# Patient Record
Sex: Female | Born: 1955 | Race: White | Hispanic: No | Marital: Married | Smoking: Never smoker
Health system: Southern US, Community
[De-identification: ages and names within clinical notes are randomized; demographics above are authoritative.]

## PROBLEM LIST (undated history)

## (undated) DIAGNOSIS — E119 Type 2 diabetes mellitus without complications: Secondary | ICD-10-CM

## (undated) DIAGNOSIS — M1990 Unspecified osteoarthritis, unspecified site: Secondary | ICD-10-CM

## (undated) DIAGNOSIS — I10 Essential (primary) hypertension: Secondary | ICD-10-CM

## (undated) DIAGNOSIS — H409 Unspecified glaucoma: Secondary | ICD-10-CM

## (undated) DIAGNOSIS — E785 Hyperlipidemia, unspecified: Secondary | ICD-10-CM

## (undated) DIAGNOSIS — E669 Obesity, unspecified: Secondary | ICD-10-CM

## (undated) HISTORY — DX: Unspecified osteoarthritis, unspecified site: M19.90

## (undated) HISTORY — PX: KNEE SURGERY: SHX244

## (undated) HISTORY — PX: TUBAL LIGATION: SHX77

## (undated) HISTORY — PX: WRIST SURGERY: SHX841

## (undated) HISTORY — PX: WISDOM TOOTH EXTRACTION: SHX21

## (undated) HISTORY — DX: Unspecified glaucoma: H40.9

## (undated) HISTORY — DX: Type 2 diabetes mellitus without complications: E11.9

## (undated) HISTORY — DX: Hyperlipidemia, unspecified: E78.5

## (undated) HISTORY — DX: Obesity, unspecified: E66.9

## (undated) HISTORY — DX: Essential (primary) hypertension: I10

---

## 1998-08-05 ENCOUNTER — Ambulatory Visit (HOSPITAL_COMMUNITY): Admission: RE | Admit: 1998-08-05 | Discharge: 1998-08-05 | Payer: Self-pay | Admitting: Internal Medicine

## 1999-08-07 ENCOUNTER — Ambulatory Visit (HOSPITAL_COMMUNITY): Admission: RE | Admit: 1999-08-07 | Discharge: 1999-08-07 | Payer: Self-pay | Admitting: Internal Medicine

## 1999-08-07 ENCOUNTER — Encounter: Payer: Self-pay | Admitting: Internal Medicine

## 2001-02-10 ENCOUNTER — Encounter: Payer: Self-pay | Admitting: Internal Medicine

## 2001-02-10 ENCOUNTER — Ambulatory Visit (HOSPITAL_COMMUNITY): Admission: RE | Admit: 2001-02-10 | Discharge: 2001-02-10 | Payer: Self-pay | Admitting: Internal Medicine

## 2001-04-21 ENCOUNTER — Other Ambulatory Visit: Admission: RE | Admit: 2001-04-21 | Discharge: 2001-04-21 | Payer: Self-pay | Admitting: Obstetrics and Gynecology

## 2003-11-27 ENCOUNTER — Other Ambulatory Visit: Admission: RE | Admit: 2003-11-27 | Discharge: 2003-11-27 | Payer: Self-pay | Admitting: Obstetrics and Gynecology

## 2007-06-30 ENCOUNTER — Ambulatory Visit (HOSPITAL_COMMUNITY): Admission: RE | Admit: 2007-06-30 | Discharge: 2007-06-30 | Payer: Self-pay | Admitting: Internal Medicine

## 2010-10-09 ENCOUNTER — Other Ambulatory Visit: Admission: RE | Admit: 2010-10-09 | Discharge: 2010-10-09 | Payer: Self-pay | Admitting: Internal Medicine

## 2012-02-10 ENCOUNTER — Other Ambulatory Visit (HOSPITAL_COMMUNITY): Payer: Self-pay | Admitting: Internal Medicine

## 2012-02-10 DIAGNOSIS — Z1231 Encounter for screening mammogram for malignant neoplasm of breast: Secondary | ICD-10-CM

## 2012-03-01 ENCOUNTER — Ambulatory Visit: Payer: BC Managed Care – PPO

## 2012-03-01 ENCOUNTER — Ambulatory Visit (INDEPENDENT_AMBULATORY_CARE_PROVIDER_SITE_OTHER): Payer: BC Managed Care – PPO | Admitting: Internal Medicine

## 2012-03-01 VITALS — BP 123/77 | HR 72 | Temp 98.5°F | Resp 18 | Ht 62.0 in | Wt 237.0 lb

## 2012-03-01 DIAGNOSIS — M25569 Pain in unspecified knee: Secondary | ICD-10-CM

## 2012-03-01 NOTE — Progress Notes (Signed)
  Subjective:    Patient ID: Jocelyn Meyer, female    DOB: 1956-11-06, 56 y.o.   MRN: [id number]  HPI No injury. Has pain every time she gets up to walk, has no pain now and can walk without pain now. Pain is in crease of knee posterior. No locking ,popping, giving way. Pain is unpredictable at times   Review of Systems     Objective:   Physical Exam R knee has full ROM Mcmurrys, patella stretch,, drawer all negative. No effusion or swelling and no palpable tenderness  [facility] reading (PRIMARY) by  Dr.Jinelle Butchko Normal knee        Assessment & Plan:  Probable mild meniscus injury Knee exercises and stretches Tylenol Consider orhto consult

## 2012-03-04 ENCOUNTER — Ambulatory Visit (HOSPITAL_COMMUNITY)
Admission: RE | Admit: 2012-03-04 | Discharge: 2012-03-04 | Disposition: A | Discharge status: Home / Self Care | Payer: BC Managed Care – PPO | Source: Ambulatory Visit | Attending: Internal Medicine | Admitting: Internal Medicine

## 2012-03-04 DIAGNOSIS — Z1231 Encounter for screening mammogram for malignant neoplasm of breast: Secondary | ICD-10-CM | POA: Insufficient documentation

## 2012-03-10 ENCOUNTER — Ambulatory Visit (HOSPITAL_COMMUNITY)
Admission: RE | Admit: 2012-03-10 | Discharge: 2012-03-10 | Disposition: A | Discharge status: Home / Self Care | Payer: BC Managed Care – PPO | Source: Ambulatory Visit | Attending: Orthopedic Surgery | Admitting: Orthopedic Surgery

## 2012-03-10 DIAGNOSIS — M7989 Other specified soft tissue disorders: Secondary | ICD-10-CM | POA: Insufficient documentation

## 2012-03-10 DIAGNOSIS — M79609 Pain in unspecified limb: Secondary | ICD-10-CM

## 2012-03-10 NOTE — Progress Notes (Signed)
VASCULAR LAB PRELIMINARY  PRELIMINARY  PRELIMINARY  PRELIMINARY  Left lower extremity venous duplex has been completed.    Preliminary report: Left leg is negative for deep and superficial vein thrombosis.  Very little fluid was noted in the popliteal fossa or in the proximal calf region.  Vanna Scotland,  RVT 03/10/2012, 6:18 PM

## 2013-09-14 ENCOUNTER — Ambulatory Visit: Payer: BC Managed Care – PPO

## 2013-09-19 ENCOUNTER — Ambulatory Visit: Payer: BC Managed Care – PPO

## 2013-09-21 ENCOUNTER — Ambulatory Visit: Payer: BC Managed Care – PPO

## 2013-09-27 ENCOUNTER — Encounter: Payer: BC Managed Care – PPO | Attending: Nurse Practitioner | Admitting: Dietician

## 2013-09-27 ENCOUNTER — Encounter: Payer: Self-pay | Admitting: Dietician

## 2013-09-27 VITALS — Ht 64.0 in | Wt 215.8 lb

## 2013-09-27 DIAGNOSIS — E119 Type 2 diabetes mellitus without complications: Secondary | ICD-10-CM

## 2013-09-27 DIAGNOSIS — Z713 Dietary counseling and surveillance: Secondary | ICD-10-CM | POA: Insufficient documentation

## 2013-09-27 NOTE — Patient Instructions (Signed)
Goals:  Monitor glucose levels as instructed by your doctor  Bring food record and glucose log to your next nutrition visit 

## 2013-09-27 NOTE — Progress Notes (Signed)
Patient was seen on 09/27/13 for the first of a series of three diabetes self-management courses at the [facility].  Current HbA1c: 6.6 on 10/1  The following learning objectives were met by the patient during this class:  Describe diabetes  State some common risk factors for diabetes  Defines the role of glucose and insulin  Identifies type of diabetes and pathophysiology  Describe the relationship between diabetes and cardiovascular risk  State the members of the Healthcare Team  States the rationale for glucose monitoring  State when to test glucose  State their individual Target Range  State the importance of logging glucose readings  Describe how to interpret glucose readings  Identifies A1C target  Explain the correlation between A1c and eAG values  State symptoms and treatment of high blood glucose  State symptoms and treatment of low blood glucose  Explain proper technique for glucose testing  Identifies proper sharps disposal  Handouts given during class include:  Living Well with Diabetes book  Carb Counting and Meal Planning book  Meal Plan Card  Carbohydrate guide  Meal planning worksheet  Low Sodium Flavoring Tips  The diabetes portion plate  Low Carbohydrate Snack Suggestions  A1c to eAG Conversion Chart  Diabetes Medications  Stress Management  Diabetes Recommended Care Schedule  Diabetes Success Plan  Core Class Satisfaction Survey  Your patient has identified their diabetes care support plan as:  [facility]  Staff   Follow-Up Plan:  Attend core 2

## 2013-10-03 ENCOUNTER — Ambulatory Visit: Payer: BC Managed Care – PPO

## 2013-10-04 ENCOUNTER — Ambulatory Visit: Payer: BC Managed Care – PPO

## 2013-10-11 ENCOUNTER — Ambulatory Visit: Payer: BC Managed Care – PPO

## 2013-10-12 DIAGNOSIS — E119 Type 2 diabetes mellitus without complications: Secondary | ICD-10-CM

## 2013-10-12 NOTE — Progress Notes (Signed)
Patient was seen on 10/12/13 for the second of a series of three diabetes self-management courses at the [facility]. The following learning objectives were met by the patient during this class:   Describe the role of different macronutrients on glucose  Explain how carbohydrates affect blood glucose  State what foods contain the most carbohydrates  Demonstrate carbohydrate counting  Demonstrate how to read Nutrition Facts food label  Describe effects of various fats on heart health  Describe the importance of good nutrition for health and healthy eating strategies  Describe techniques for managing your shopping, cooking and meal planning  List strategies to follow meal plan when dining out  Describe the effects of alcohol on glucose and how to use it safely  Follow-Up Plan:  Attend Core 3  Work towards following your personal food plan.   

## 2013-10-18 ENCOUNTER — Ambulatory Visit: Payer: BC Managed Care – PPO

## 2013-10-24 ENCOUNTER — Ambulatory Visit: Payer: BC Managed Care – PPO

## 2013-11-01 ENCOUNTER — Encounter: Payer: BC Managed Care – PPO | Attending: Nurse Practitioner

## 2013-11-01 DIAGNOSIS — E119 Type 2 diabetes mellitus without complications: Secondary | ICD-10-CM | POA: Insufficient documentation

## 2013-11-01 DIAGNOSIS — Z713 Dietary counseling and surveillance: Secondary | ICD-10-CM | POA: Insufficient documentation

## 2013-11-01 NOTE — Progress Notes (Signed)
Patient was seen on 11/01/13 for the third of a series of three diabetes self-management courses at the [facility]. The following learning objectives were met by the patient during this class:    State the amount of activity recommended for healthy living   Describe activities suitable for individual needs   Identify ways to regularly incorporate activity into daily life   Identify barriers to activity and ways to over come these barriers  Identify diabetes medications being personally used and their primary action for lowering glucose and possible side effects   Describe role of stress on blood glucose and develop strategies to address psychosocial issues   Identify diabetes complications and ways to prevent them  Explain how to manage diabetes during illness   Evaluate success in meeting personal goal   Establish 2-3 goals that they will plan to diligently work on until they return for the  50-month follow-up visit  Goals:  Follow Diabetes Meal Plan as instructed  Aim for 15-30 mins of physical activity daily as tolerated  Bring food record and glucose log to your follow up visit  Your patient has established the following 4 month goals in their individualized success plan:  Count carbohydrates at most meals and snacks  Be active 30 minutes or more 5 times a week  Your patient has identified these potential barriers to change:  time  weather  Your patient has identified their diabetes self-care support plan as  [employer]  family

## 2013-12-27 ENCOUNTER — Other Ambulatory Visit (HOSPITAL_COMMUNITY)
Admission: RE | Admit: 2013-12-27 | Discharge: 2013-12-27 | Disposition: A | Discharge status: Home / Self Care | Payer: BC Managed Care – PPO | Source: Ambulatory Visit | Attending: Internal Medicine | Admitting: Internal Medicine

## 2013-12-27 DIAGNOSIS — Z1151 Encounter for screening for human papillomavirus (HPV): Secondary | ICD-10-CM | POA: Insufficient documentation

## 2013-12-27 DIAGNOSIS — Z124 Encounter for screening for malignant neoplasm of cervix: Secondary | ICD-10-CM | POA: Insufficient documentation

## 2014-03-06 ENCOUNTER — Ambulatory Visit: Payer: BC Managed Care – PPO | Admitting: *Deleted

## 2014-04-02 ENCOUNTER — Ambulatory Visit: Payer: BC Managed Care – PPO

## 2014-04-02 ENCOUNTER — Ambulatory Visit (INDEPENDENT_AMBULATORY_CARE_PROVIDER_SITE_OTHER): Payer: BC Managed Care – PPO | Admitting: Internal Medicine

## 2014-04-02 VITALS — BP 132/84 | HR 64 | Temp 98.0°F | Resp 17 | Ht 61.5 in | Wt 217.0 lb

## 2014-04-02 DIAGNOSIS — M542 Cervicalgia: Secondary | ICD-10-CM

## 2014-04-02 DIAGNOSIS — E119 Type 2 diabetes mellitus without complications: Secondary | ICD-10-CM | POA: Insufficient documentation

## 2014-04-02 MED ORDER — METHOCARBAMOL 750 MG PO TABS: 750.0000 mg | ORAL_TABLET | Freq: Four times a day (QID) | ORAL | Status: DC

## 2014-04-02 MED ORDER — NALBUPHINE HCL 20 MG/ML IJ SOLN
20.0000 mg | Freq: Once | INTRAMUSCULAR | Status: AC
  Administered 2014-04-02: 20 mg via INTRAMUSCULAR

## 2014-04-02 MED ORDER — HYDROCODONE-ACETAMINOPHEN 5-325 MG PO TABS: 1.0000 | ORAL_TABLET | Freq: Four times a day (QID) | ORAL | Status: DC | PRN

## 2014-04-02 NOTE — Progress Notes (Signed)
   Subjective:    Patient ID: Jocelyn Meyer, female    DOB: 10/16/1956, 58 y.o.   MRN: [id number]  HPI    Review of Systems     Objective:   Physical Exam        Assessment & Plan:

## 2014-04-02 NOTE — Progress Notes (Signed)
   Subjective:    Patient ID: Jocelyn Meyer, female    DOB: 12/13/1955, 58 y.o.   MRN: [id number]  HPI 58 year old female complains of neck pain. She has hade this pain since yesterday morning around 8 am. The pain is radiating down her left arm. Feels like she has a knot in her left shoulder that won't go away with tingling in her fingers on left hand. She has tried hot and cold compresses and Tylenol extra strenth but the pain does not subside. She says its hard for her to hold her head up.    Review of Systems     Objective:   Physical Exam  Constitutional: She is oriented to person, place, and time. Vital signs are normal. She appears well-nourished. She is cooperative. She appears distressed.  In pain, holding neck funny  HENT:  Head: Normocephalic.  Eyes: EOM are normal.  Cardiovascular: Normal rate.   Pulmonary/Chest: Effort normal.  Musculoskeletal:       Cervical back: She exhibits decreased range of motion, tenderness, bony tenderness, pain and spasm. She exhibits no swelling, no edema, no deformity, no laceration and normal pulse.       Back:  Neurological: She is alert and oriented to person, place, and time. She has normal strength. A sensory deficit is present. She exhibits normal muscle tone. Coordination normal.  Psychiatric: She has a normal mood and affect. Judgment and thought content normal. She is agitated. Cognition and memory are normal.     [facility] reading (PRIMARY) by  Dr.Kobe Jansma normal cspine, mild arthritis       Assessment & Plan:  Torticollis/Acute neck spasm Nubain im Vicodin/Robaxin RTC 3d if not well

## 2014-04-02 NOTE — Patient Instructions (Signed)
Cervical Sprain A cervical sprain is an injury in the neck in which the strong, fibrous tissues (ligaments) that connect your neck bones stretch or tear. Cervical sprains can range from mild to severe. Severe cervical sprains can cause the neck vertebrae to be unstable. This can lead to damage of the spinal cord and can result in serious nervous system problems. The amount of time it takes for a cervical sprain to get better depends on the cause and extent of the injury. Most cervical sprains heal in 1 to 3 weeks. CAUSES  Severe cervical sprains may be caused by:   Contact sport injuries (such as from football, rugby, wrestling, hockey, auto racing, gymnastics, diving, martial arts, or boxing).   Motor vehicle collisions.   Whiplash injuries. This is an injury from a sudden forward-and backward whipping movement of the head and neck.  Falls.  Mild cervical sprains may be caused by:   Being in an awkward position, such as while cradling a telephone between your ear and shoulder.   Sitting in a chair that does not offer proper support.   Working at a poorly designed computer station.   Looking up or down for long periods of time.  SYMPTOMS   Pain, soreness, stiffness, or a burning sensation in the front, back, or sides of the neck. This discomfort may develop immediately after the injury or slowly, 24 hours or more after the injury.   Pain or tenderness directly in the middle of the back of the neck.   Shoulder or upper back pain.   Limited ability to move the neck.   Headache.   Dizziness.   Weakness, numbness, or tingling in the hands or arms.   Muscle spasms.   Difficulty swallowing or chewing.   Tenderness and swelling of the neck.  DIAGNOSIS  Most of the time your health care provider can diagnose a cervical sprain by taking your history and doing a physical exam. Your health care provider will ask about previous neck injuries and any known neck  problems, such as arthritis in the neck. X-rays may be taken to find out if there are any other problems, such as with the bones of the neck. Other tests, such as a CT scan or MRI, may also be needed.  TREATMENT  Treatment depends on the severity of the cervical sprain. Mild sprains can be treated with rest, keeping the neck in place (immobilization), and pain medicines. Severe cervical sprains are immediately immobilized. Further treatment is done to help with pain, muscle spasms, and other symptoms and may include:  Medicines, such as pain relievers, numbing medicines, or muscle relaxants.   Physical therapy. This may involve stretching exercises, strengthening exercises, and posture training. Exercises and improved posture can help stabilize the neck, strengthen muscles, and help stop symptoms from returning.  HOME CARE INSTRUCTIONS   Put ice on the injured area.   Put ice in a plastic bag.   Place a towel between your skin and the bag.   Leave the ice on for 15 20 minutes, 3 4 times a day.   If your injury was severe, you may have been given a cervical collar to wear. A cervical collar is a two-piece collar designed to keep your neck from moving while it heals.  Do not remove the collar unless instructed by your health care provider.  If you have long hair, keep it outside of the collar.  Ask your health care provider before making any adjustments to your collar.   Minor adjustments may be required over time to improve comfort and reduce pressure on your chin or on the back of your head.  Ifyou are allowed to remove the collar for cleaning or bathing, follow your health care provider's instructions on how to do so safely.  Keep your collar clean by wiping it with mild soap and water and drying it completely. If the collar you have been given includes removable pads, remove them every 1 2 days and hand wash them with soap and water. Allow them to air dry. They should be completely  dry before you wear them in the collar.  If you are allowed to remove the collar for cleaning and bathing, wash and dry the skin of your neck. Check your skin for irritation or sores. If you see any, tell your health care provider.  Do not drive while wearing the collar.   Only take over-the-counter or prescription medicines for pain, discomfort, or fever as directed by your health care provider.   Keep all follow-up appointments as directed by your health care provider.   Keep all physical therapy appointments as directed by your health care provider.   Make any needed adjustments to your workstation to promote good posture.   Avoid positions and activities that make your symptoms worse.   Warm up and stretch before being active to help prevent problems.  SEEK MEDICAL CARE IF:   Your pain is not controlled with medicine.   You are unable to decrease your pain medicine over time as planned.   Your activity level is not improving as expected.  SEEK IMMEDIATE MEDICAL CARE IF:   You develop any bleeding.  You develop stomach upset.  You have signs of an allergic reaction to your medicine.   Your symptoms get worse.   You develop new, unexplained symptoms.   You have numbness, tingling, weakness, or paralysis in any part of your body.  MAKE SURE YOU:   Understand these instructions.  Will watch your condition.  Will get help right away if you are not doing well or get worse. Document Released: 09/06/2007 Document Revised: 08/30/2013 Document Reviewed: 05/17/2013 [employer] Patient Information 2014 [employer]  

## 2015-12-25 ENCOUNTER — Ambulatory Visit (INDEPENDENT_AMBULATORY_CARE_PROVIDER_SITE_OTHER): Payer: BC Managed Care – PPO | Admitting: Family Medicine

## 2015-12-25 VITALS — BP 126/74 | HR 64 | Temp 98.7°F | Resp 18 | Ht 61.25 in | Wt 217.2 lb

## 2015-12-25 DIAGNOSIS — M545 Low back pain, unspecified: Secondary | ICD-10-CM

## 2015-12-25 DIAGNOSIS — E1165 Type 2 diabetes mellitus with hyperglycemia: Secondary | ICD-10-CM

## 2015-12-25 DIAGNOSIS — IMO0001 Reserved for inherently not codable concepts without codable children: Secondary | ICD-10-CM

## 2015-12-25 LAB — GLUCOSE, POCT (MANUAL RESULT ENTRY): POC GLUCOSE: 138 mg/dL — AB (ref 70–99)

## 2015-12-25 LAB — POCT GLYCOSYLATED HEMOGLOBIN (HGB A1C): Hemoglobin A1C: 7.3

## 2015-12-25 MED ORDER — CYCLOBENZAPRINE HCL 5 MG PO TABS: ORAL_TABLET | ORAL | Status: DC

## 2015-12-25 NOTE — Patient Instructions (Addendum)
I suspect you had a strain of the low back, or a flare of previous arthritis. Exam is overall reassuring here today. Can temporarily increase the dose of your diclofenac to 3 times per day, but only use this dose as long as needed due to risks that we discussed. Do not combine with over-the-counter Advil, Aleve, or other anti-inflammatories. I did prescribe a muscle relaxant that can be taken up to every 8 hours, but start with the lowest effective dose as it does cause sedation. If not improving the next 1-2 weeks, or worsening sooner, return here or your other care provider for possible x-rays or other treatment.  Your 3 month blood sugar average is slightly over recommend level of 7.  Follow-up with your primary care provider to decide on changes to your current regimen, as you had stomach upset/diarrhea with increasing the metformin to 500 mg. In the meantime, be careful with carbohydrates, and sugars in the diet, including any sugar containing beverages.  Low Back Strain With Rehab A strain is an injury in which a tendon or muscle is torn. The muscles and tendons of the lower back are vulnerable to strains. However, these muscles and tendons are very strong and require a great force to be injured. Strains are classified into three categories. Grade 1 strains cause pain, but the tendon is not lengthened. Grade 2 strains include a lengthened ligament, due to the ligament being stretched or partially ruptured. With grade 2 strains there is still function, although the function may be decreased. Grade 3 strains involve a complete tear of the tendon or muscle, and function is usually impaired. SYMPTOMS   Pain in the lower back.  Pain that affects one side more than the other.  Pain that gets worse with movement and may be felt in the hip, buttocks, or back of the thigh.  Muscle spasms of the muscles in the back.  Swelling along the muscles of the back.  Loss of strength of the back  muscles.  Crackling sound (crepitation) when the muscles are touched. CAUSES  Lower back strains occur when a force is placed on the muscles or tendons that is greater than they can handle. Common causes of injury include:  Prolonged overuse of the muscle-tendon units in the lower back, usually from incorrect posture.  A single violent injury or force applied to the back. RISK INCREASES WITH:  Sports that involve twisting forces on the spine or a lot of bending at the waist (football, rugby, weightlifting, bowling, golf, tennis, speed skating, racquetball, swimming, running, gymnastics, diving).  Poor strength and flexibility.  Failure to warm up properly before activity.  Family history of lower back pain or disk disorders.  Previous back injury or surgery (especially fusion).  Poor posture with lifting, especially heavy objects.  Prolonged sitting, especially with poor posture. PREVENTION   Learn and use proper posture when sitting or lifting (maintain proper posture when sitting, lift using the knees and legs, not at the waist).  Warm up and stretch properly before activity.  Allow for adequate recovery between workouts.  Maintain physical fitness:  Strength, flexibility, and endurance.  Cardiovascular fitness. PROGNOSIS  If treated properly, lower back strains usually heal within 6 weeks. RELATED COMPLICATIONS   Recurring symptoms, resulting in a chronic problem.  Chronic inflammation, scarring, and partial muscle-tendon tear.  Delayed healing or resolution of symptoms.  Prolonged disability. TREATMENT  Treatment first involves the use of ice and medicine, to reduce pain and inflammation. The use of  strengthening and stretching exercises may help reduce pain with activity. These exercises may be performed at home or with a therapist. Severe injuries may require referral to a therapist for further evaluation and treatment, such as ultrasound. Your caregiver may  advise that you wear a back brace or corset, to help reduce pain and discomfort. Often, prolonged bed rest results in greater harm then benefit. Corticosteroid injections may be recommended. However, these should be reserved for the most serious cases. It is important to avoid using your back when lifting objects. At night, sleep on your back on a firm mattress with a pillow placed under your knees. If non-surgical treatment is unsuccessful, surgery may be needed.  MEDICATION   If pain medicine is needed, nonsteroidal anti-inflammatory medicines (aspirin and ibuprofen), or other minor pain relievers (acetaminophen), are often advised.  Do not take pain medicine for 7 days before surgery.  Prescription pain relievers may be given, if your caregiver thinks they are needed. Use only as directed and only as much as you need.  Ointments applied to the skin may be helpful.  Corticosteroid injections may be given by your caregiver. These injections should be reserved for the most serious cases, because they may only be given a certain number of times. HEAT AND COLD  Cold treatment (icing) should be applied for 10 to 15 minutes every 2 to 3 hours for inflammation and pain, and immediately after activity that aggravates your symptoms. Use ice packs or an ice massage.  Heat treatment may be used before performing stretching and strengthening activities prescribed by your caregiver, physical therapist, or athletic trainer. Use a heat pack or a warm water soak. SEEK MEDICAL CARE IF:   Symptoms get worse or do not improve in 2 to 4 weeks, despite treatment.  You develop numbness, weakness, or loss of bowel or bladder function.  New, unexplained symptoms develop. (Drugs used in treatment may produce side effects.) EXERCISES  RANGE OF MOTION (ROM) AND STRETCHING EXERCISES - Low Back Strain Most people with lower back pain will find that their symptoms get worse with excessive bending forward (flexion) or  arching at the lower back (extension). The exercises which will help resolve your symptoms will focus on the opposite motion.  Your physician, physical therapist or athletic trainer will help you determine which exercises will be most helpful to resolve your lower back pain. Do not complete any exercises without first consulting with your caregiver. Discontinue any exercises which make your symptoms worse until you speak to your caregiver.  If you have pain, numbness or tingling which travels down into your buttocks, leg or foot, the goal of the therapy is for these symptoms to move closer to your back and eventually resolve. Sometimes, these leg symptoms will get better, but your lower back pain may worsen. This is typically an indication of progress in your rehabilitation. Be very alert to any changes in your symptoms and the activities in which you participated in the 24 hours prior to the change. Sharing this information with your caregiver will allow him/her to most efficiently treat your condition.  These exercises may help you when beginning to rehabilitate your injury. Your symptoms may resolve with or without further involvement from your physician, physical therapist or athletic trainer. While completing these exercises, remember:  Restoring tissue flexibility helps normal motion to return to the joints. This allows healthier, less painful movement and activity.  An effective stretch should be held for at least 30 seconds.  A stretch  should never be painful. You should only feel a gentle lengthening or release in the stretched tissue. FLEXION RANGE OF MOTION AND STRETCHING EXERCISES: STRETCH - Flexion, Single Knee to Chest   Lie on a firm bed or floor with both legs extended in front of you.  Keeping one leg in contact with the floor, bring your opposite knee to your chest. Hold your leg in place by either grabbing behind your thigh or at your knee.  Pull until you feel a gentle stretch  in your lower back. Hold __________ seconds.  Slowly release your grasp and repeat the exercise with the opposite side. Repeat __________ times. Complete this exercise __________ times per day.  STRETCH - Flexion, Double Knee to Chest   Lie on a firm bed or floor with both legs extended in front of you.  Keeping one leg in contact with the floor, bring your opposite knee to your chest.  Tense your stomach muscles to support your back and then lift your other knee to your chest. Hold your legs in place by either grabbing behind your thighs or at your knees.  Pull both knees toward your chest until you feel a gentle stretch in your lower back. Hold __________ seconds.  Tense your stomach muscles and slowly return one leg at a time to the floor. Repeat __________ times. Complete this exercise __________ times per day.  STRETCH - Low Trunk Rotation  Lie on a firm bed or floor. Keeping your legs in front of you, bend your knees so they are both pointed toward the ceiling and your feet are flat on the floor.  Extend your arms out to the side. This will stabilize your upper body by keeping your shoulders in contact with the floor.  Gently and slowly drop both knees together to one side until you feel a gentle stretch in your lower back. Hold for __________ seconds.  Tense your stomach muscles to support your lower back as you bring your knees back to the starting position. Repeat the exercise to the other side. Repeat __________ times. Complete this exercise __________ times per day  EXTENSION RANGE OF MOTION AND FLEXIBILITY EXERCISES: STRETCH - Extension, Prone on Elbows   Lie on your stomach on the floor, a bed will be too soft. Place your palms about shoulder width apart and at the height of your head.  Place your elbows under your shoulders. If this is too painful, stack pillows under your chest.  Allow your body to relax so that your hips drop lower and make contact more completely  with the floor.  Hold this position for __________ seconds.  Slowly return to lying flat on the floor. Repeat __________ times. Complete this exercise __________ times per day.  RANGE OF MOTION - Extension, Prone Press Ups  Lie on your stomach on the floor, a bed will be too soft. Place your palms about shoulder width apart and at the height of your head.  Keeping your back as relaxed as possible, slowly straighten your elbows while keeping your hips on the floor. You may adjust the placement of your hands to maximize your comfort. As you gain motion, your hands will come more underneath your shoulders.  Hold this position __________ seconds.  Slowly return to lying flat on the floor. Repeat __________ times. Complete this exercise __________ times per day.  RANGE OF MOTION- Quadruped, Neutral Spine   Assume a hands and knees position on a firm surface. Keep your hands under your shoulders  and your knees under your hips. You may place padding under your knees for comfort.  Drop your head and point your tail bone toward the ground below you. This will round out your lower back like an angry cat. Hold this position for __________ seconds.  Slowly lift your head and release your tail bone so that your back sags into a large arch, like an old horse.  Hold this position for __________ seconds.  Repeat this until you feel limber in your lower back.  Now, find your "sweet spot." This will be the most comfortable position somewhere between the two previous positions. This is your neutral spine. Once you have found this position, tense your stomach muscles to support your lower back.  Hold this position for __________ seconds. Repeat __________ times. Complete this exercise __________ times per day.  STRENGTHENING EXERCISES - Low Back Strain These exercises may help you when beginning to rehabilitate your injury. These exercises should be done near your "sweet spot." This is the neutral,  low-back arch, somewhere between fully rounded and fully arched, that is your least painful position. When performed in this safe range of motion, these exercises can be used for people who have either a flexion or extension based injury. These exercises may resolve your symptoms with or without further involvement from your physician, physical therapist or athletic trainer. While completing these exercises, remember:   Muscles can gain both the endurance and the strength needed for everyday activities through controlled exercises.  Complete these exercises as instructed by your physician, physical therapist or athletic trainer. Increase the resistance and repetitions only as guided.  You may experience muscle soreness or fatigue, but the pain or discomfort you are trying to eliminate should never worsen during these exercises. If this pain does worsen, stop and make certain you are following the directions exactly. If the pain is still present after adjustments, discontinue the exercise until you can discuss the trouble with your caregiver. STRENGTHENING - Deep Abdominals, Pelvic Tilt  Lie on a firm bed or floor. Keeping your legs in front of you, bend your knees so they are both pointed toward the ceiling and your feet are flat on the floor.  Tense your lower abdominal muscles to press your lower back into the floor. This motion will rotate your pelvis so that your tail bone is scooping upwards rather than pointing at your feet or into the floor.  With a gentle tension and even breathing, hold this position for __________ seconds. Repeat __________ times. Complete this exercise __________ times per day.  STRENGTHENING - Abdominals, Crunches   Lie on a firm bed or floor. Keeping your legs in front of you, bend your knees so they are both pointed toward the ceiling and your feet are flat on the floor. Cross your arms over your chest.  Slightly tip your chin down without bending your neck.  Tense  your abdominals and slowly lift your trunk high enough to just clear your shoulder blades. Lifting higher can put excessive stress on the lower back and does not further strengthen your abdominal muscles.  Control your return to the starting position. Repeat __________ times. Complete this exercise __________ times per day.  STRENGTHENING - Quadruped, Opposite UE/LE Lift   Assume a hands and knees position on a firm surface. Keep your hands under your shoulders and your knees under your hips. You may place padding under your knees for comfort.  Find your neutral spine and gently tense your abdominal muscles  so that you can maintain this position. Your shoulders and hips should form a rectangle that is parallel with the floor and is not twisted.  Keeping your trunk steady, lift your right hand no higher than your shoulder and then your left leg no higher than your hip. Make sure you are not holding your breath. Hold this position __________ seconds.  Continuing to keep your abdominal muscles tense and your back steady, slowly return to your starting position. Repeat with the opposite arm and leg. Repeat __________ times. Complete this exercise __________ times per day.  STRENGTHENING - Lower Abdominals, Double Knee Lift  Lie on a firm bed or floor. Keeping your legs in front of you, bend your knees so they are both pointed toward the ceiling and your feet are flat on the floor.  Tense your abdominal muscles to brace your lower back and slowly lift both of your knees until they come over your hips. Be certain not to hold your breath.  Hold __________ seconds. Using your abdominal muscles, return to the starting position in a slow and controlled manner. Repeat __________ times. Complete this exercise __________ times per day.  POSTURE AND BODY MECHANICS CONSIDERATIONS - Low Back Strain Keeping correct posture when sitting, standing or completing your activities will reduce the stress put on  different body tissues, allowing injured tissues a chance to heal and limiting painful experiences. The following are general guidelines for improved posture. Your physician or physical therapist will provide you with any instructions specific to your needs. While reading these guidelines, remember:  The exercises prescribed by your provider will help you have the flexibility and strength to maintain correct postures.  The correct posture provides the best environment for your joints to work. All of your joints have less wear and tear when properly supported by a spine with good posture. This means you will experience a healthier, less painful body.  Correct posture must be practiced with all of your activities, especially prolonged sitting and standing. Correct posture is as important when doing repetitive low-stress activities (typing) as it is when doing a single heavy-load activity (lifting). RESTING POSITIONS Consider which positions are most painful for you when choosing a resting position. If you have pain with flexion-based activities (sitting, bending, stooping, squatting), choose a position that allows you to rest in a less flexed posture. You would want to avoid curling into a fetal position on your side. If your pain worsens with extension-based activities (prolonged standing, working overhead), avoid resting in an extended position such as sleeping on your stomach. Most people will find more comfort when they rest with their spine in a more neutral position, neither too rounded nor too arched. Lying on a non-sagging bed on your side with a pillow between your knees, or on your back with a pillow under your knees will often provide some relief. Keep in mind, being in any one position for a prolonged period of time, no matter how correct your posture, can still lead to stiffness. PROPER SITTING POSTURE In order to minimize stress and discomfort on your spine, you must sit with correct posture.  Sitting with good posture should be effortless for a healthy body. Returning to good posture is a gradual process. Many people can work toward this most comfortably by using various supports until they have the flexibility and strength to maintain this posture on their own. When sitting with proper posture, your ears will fall over your shoulders and your shoulders will fall over your  hips. You should use the back of the chair to support your upper back. Your lower back will be in a neutral position, just slightly arched. You may place a small pillow or folded towel at the base of your lower back for support.  When working at a desk, create an environment that supports good, upright posture. Without extra support, muscles tire, which leads to excessive strain on joints and other tissues. Keep these recommendations in mind: CHAIR:  A chair should be able to slide under your desk when your back makes contact with the back of the chair. This allows you to work closely.  The chair's height should allow your eyes to be level with the upper part of your monitor and your hands to be slightly lower than your elbows. BODY POSITION  Your feet should make contact with the floor. If this is not possible, use a foot rest.  Keep your ears over your shoulders. This will reduce stress on your neck and lower back. INCORRECT SITTING POSTURES  If you are feeling tired and unable to assume a healthy sitting posture, do not slouch or slump. This puts excessive strain on your back tissues, causing more damage and pain. Healthier options include:  Using more support, like a lumbar pillow.  Switching tasks to something that requires you to be upright or walking.  Talking a brief walk.  Lying down to rest in a neutral-spine position. PROLONGED STANDING WHILE SLIGHTLY LEANING FORWARD  When completing a task that requires you to lean forward while standing in one place for a long time, place either foot up on a  stationary 2-4 inch high object to help maintain the best posture. When both feet are on the ground, the lower back tends to lose its slight inward curve. If this curve flattens (or becomes too large), then the back and your other joints will experience too much stress, tire more quickly, and can cause pain. CORRECT STANDING POSTURES Proper standing posture should be assumed with all daily activities, even if they only take a few moments, like when brushing your teeth. As in sitting, your ears should fall over your shoulders and your shoulders should fall over your hips. You should keep a slight tension in your abdominal muscles to brace your spine. Your tailbone should point down to the ground, not behind your body, resulting in an over-extended swayback posture.  INCORRECT STANDING POSTURES  Common incorrect standing postures include a forward head, locked knees and/or an excessive swayback. WALKING Walk with an upright posture. Your ears, shoulders and hips should all line-up. PROLONGED ACTIVITY IN A FLEXED POSITION When completing a task that requires you to bend forward at your waist or lean over a low surface, try to find a way to stabilize 3 out of 4 of your limbs. You can place a hand or elbow on your thigh or rest a knee on the surface you are reaching across. This will provide you more stability so that your muscles do not fatigue as quickly. By keeping your knees relaxed, or slightly bent, you will also reduce stress across your lower back. CORRECT LIFTING TECHNIQUES DO :   Assume a wide stance. This will provide you more stability and the opportunity to get as close as possible to the object which you are lifting.  Tense your abdominals to brace your spine. Bend at the knees and hips. Keeping your back locked in a neutral-spine position, lift using your leg muscles. Lift with your legs, keeping your  back straight.  Test the weight of unknown objects before attempting to lift them.  Try  to keep your elbows locked down at your sides in order get the best strength from your shoulders when carrying an object.  Always ask for help when lifting heavy or awkward objects. INCORRECT LIFTING TECHNIQUES DO NOT:   Lock your knees when lifting, even if it is a small object.  Bend and twist. Pivot at your feet or move your feet when needing to change directions.  Assume that you can safely pick up even a paper clip without proper posture.   This information is not intended to replace advice given to you by your health care provider. Make sure you discuss any questions you have with your health care provider.   Document Released: 11/09/2005 Document Revised: 11/30/2014 Document Reviewed: 02/21/2009 [employer] Patient Education [employer]

## 2015-12-25 NOTE — Progress Notes (Signed)
Subjective:    Patient ID: Jocelyn Meyer, female    DOB: Aug 09, 1956, 60 y.o.   MRN: [id number] By signing my name below, I, [name], attest that this documentation has been prepared under the direction and in the presence of [name], MD.  Electronically Signed: [name], Medical Scribe. 12/25/2015. 7:18 PM.  HPI HPI Comments: Jocelyn Meyer is a 60 y.o. female with a history of DM and arthritis who presents to the [facility] for two concerns today: low back pain and hyperglycemia.  Hyperglycemia: History of DM. Takes metformin 250 mg BID. No recent A1c on file. She is followed by Levonne Lapping, NP with [facility], who she has not seen in over 3 months. Patient checks her blood sugar every day with a goal of under 110 fasting glucose. Over the past 2 months, her fasting blood glucose has been in the 140s-150s mostly with occasional readings in the 160s. She has not had any readings over 200.   Low back pain: Patient states she pulled her back 3 days ago while she was bent over and putting handbells back. She reports having pain in the middle of the back. The pain is worse when changing positions and with movement. The pain radiates to her hips only. She is able to sleep without any problems. She has been using heat and ice. She has also tried Advil, but she did not take much because she already takes diclofenac every day for her knee pain. Patient denies leg pain, weakness, blister, rash, and saddle anesthesia. She does not take any muscle relaxants. She reports having a history of sciatica about 15 years ago.  Patient Active Problem List   Diagnosis Date Noted  . Diabetes (HCC) 04/02/2014   Past Medical History  Diagnosis Date  . Diabetes mellitus without complication (HCC)   . Hypertension   . Obesity   . Arthritis   . Glaucoma    Past Surgical History  Procedure Laterality Date  . Tubal ligation    . Wrist surgery    . Knee surgery    . Wisdom tooth  extraction     Allergies  Allergen Reactions  . Penicillins    Prior to Admission medications   Medication Sig Start Date End Date Taking? Authorizing Provider  diclofenac (VOLTAREN) 50 MG EC tablet Take 50 mg by mouth 2 (two) times daily.   Yes Historical Provider, MD  latanoprost (XALATAN) 0.005 % ophthalmic solution 1 drop at bedtime.   Yes Historical Provider, MD  metFORMIN (GLUCOPHAGE) 500 MG tablet Take 250 mg by mouth 2 (two) times daily with a meal.    Yes Historical Provider, MD  Multiple Vitamins-Minerals (MULTIVITAMIN WITH MINERALS) tablet Take 1 tablet by mouth daily.   Yes Historical Provider, MD  timolol (BETIMOL) 0.25 % ophthalmic solution 1-2 drops 2 (two) times daily.   Yes Historical Provider, MD  triamterene-hydrochlorothiazide (DYAZIDE) 37.5-25 MG per capsule Take 1 capsule by mouth every morning.   Yes Historical Provider, MD  aspirin 81 MG tablet Take 81 mg by mouth daily. Reported on 12/25/2015    Historical Provider, MD  Glucosamine 500 MG CAPS Take 1,000 mg by mouth. Reported on 12/25/2015    Historical Provider, MD  HYDROcodone-acetaminophen (NORCO/VICODIN) 5-325 MG per tablet Take 1 tablet by mouth every 6 (six) hours as needed. Patient not taking: Reported on 12/25/2015 04/02/14   Jonita Albee, MD  methocarbamol (ROBAXIN-750) 750 MG tablet Take 1 tablet (750 mg total) by mouth  4 (four) times daily. Patient not taking: Reported on 12/25/2015 04/02/14   Jonita Albee, MD   Social History   Social History  . Marital Status: Married    Spouse Name: N/A  . Number of Children: N/A  . Years of Education: N/A   Occupational History  . Not on file.   Social History Main Topics  . Smoking status: Never Smoker   . Smokeless tobacco: Not on file  . Alcohol Use: No  . Drug Use: No  . Sexual Activity: No   Other Topics Concern  . Not on file   Social History Narrative     Review of Systems  Genitourinary: Negative for enuresis.  Musculoskeletal: Positive for back  pain.  Skin: Negative for rash.  Neurological: Negative for weakness and numbness.  Psychiatric/Behavioral: Negative for sleep disturbance.       Objective:   Physical Exam  Constitutional: She is oriented to person, place, and time. She appears well-developed and well-nourished. No distress.  HENT:  Head: Normocephalic and atraumatic.  Mouth/Throat: Oropharynx is clear and moist. No oropharyngeal exudate.  Eyes: Pupils are equal, round, and reactive to light.  Neck: Neck supple.  Cardiovascular: Normal rate.   Pulmonary/Chest: Effort normal.  Musculoskeletal: She exhibits tenderness. She exhibits no edema.  Slight tenderness along the lower lumbar spine. Minimal paraspinal spasm. Lumbar ROM 90 degrees with flexion, extension intact, slightly guarded lateroflexion (left greater than right), and equal rotation. Negative seated straight leg raise.   Neurological: She is alert and oriented to person, place, and time. No cranial nerve deficit.  Reflex Scores:      Patellar reflexes are 2+ on the right side and 2+ on the left side.      Achilles reflexes are 2+ on the right side and 2+ on the left side. Able to heel-to-toe walk without difficulty.   Skin: Skin is warm and dry. No rash noted.  Psychiatric: She has a normal mood and affect. Her behavior is normal.  Nursing note and vitals reviewed.    Filed Vitals:   12/25/15 1757  BP: 126/74  Pulse: 64  Temp: 98.7 F (37.1 C)  TempSrc: Oral  Resp: 18  Height: 5' 1.25" (1.556 m)  Weight: 217 lb 3.2 oz (98.521 kg)  SpO2: 98%    Results for orders placed or performed in visit on 12/25/15  POCT glucose (manual entry)  Result Value Ref Range   POC Glucose 138 (A) 70 - 99 mg/dl  POCT glycosylated hemoglobin (Hb A1C)  Result Value Ref Range   Hemoglobin A1C 7.3        Assessment & Plan:   Jocelyn Meyer is a 61 y.o. female Uncontrolled type 2 diabetes mellitus without complication, without long-term current use of insulin  (HCC) - Plan: POCT glucose (manual entry), POCT glycosylated hemoglobin (Hb A1C)  -Borderline control, just over goal A1c. Did not tolerate increased dose of metformin. Recommended discussing with her primary care provider next step, but in the meantime strict adherence to diet was recommended.  Midline low back pain without sciatica - Plan: cyclobenzaprine (FLEXERIL) 5 MG tablet  - Suspected strain of lumbar spine versus flare of underlying degenerative disc disease. No red flags and overall reassuring exam.   -Can try increased dose of diclofenac to 50 mg 3 times a day. Short course discussed as well as side effects and risks with NSAIDs, including cardiovascular risks with underlying diabetes.  -Flexeril 1/2-1 tablet of the 5 mg 3 times a day  when necessary. Side effects discussed.  -Heat, gentle range of motion, then home exercise program as symptoms improve. Handout given.  -RTC precautions  Meds ordered this encounter  Medications  . cyclobenzaprine (FLEXERIL) 5 MG tablet    Sig: 1/2 to 1 pill by mouth up to every 8 hours as needed. Start with one pill by mouth each bedtime as needed due to sedation    Dispense:  15 tablet    Refill:  0   Patient Instructions  I suspect you had a strain of the low back, or a flare of previous arthritis. Exam is overall reassuring here today. Can temporarily increase the dose of your diclofenac to 3 times per day, but only use this dose as long as needed due to risks that we discussed. Do not combine with over-the-counter Advil, Aleve, or other anti-inflammatories. I did prescribe a muscle relaxant that can be taken up to every 8 hours, but start with the lowest effective dose as it does cause sedation. If not improving the next 1-2 weeks, or worsening sooner, return here or your other care provider for possible x-rays or other treatment.  Your 3 month blood sugar average is slightly over recommend level of 7.  Follow-up with your primary care provider to  decide on changes to your current regimen, as you had stomach upset/diarrhea with increasing the metformin to 500 mg. In the meantime, be careful with carbohydrates, and sugars in the diet, including any sugar containing beverages.  Low Back Strain With Rehab A strain is an injury in which a tendon or muscle is torn. The muscles and tendons of the lower back are vulnerable to strains. However, these muscles and tendons are very strong and require a great force to be injured. Strains are classified into three categories. Grade 1 strains cause pain, but the tendon is not lengthened. Grade 2 strains include a lengthened ligament, due to the ligament being stretched or partially ruptured. With grade 2 strains there is still function, although the function may be decreased. Grade 3 strains involve a complete tear of the tendon or muscle, and function is usually impaired. SYMPTOMS   Pain in the lower back.  Pain that affects one side more than the other.  Pain that gets worse with movement and may be felt in the hip, buttocks, or back of the thigh.  Muscle spasms of the muscles in the back.  Swelling along the muscles of the back.  Loss of strength of the back muscles.  Crackling sound (crepitation) when the muscles are touched. CAUSES  Lower back strains occur when a force is placed on the muscles or tendons that is greater than they can handle. Common causes of injury include:  Prolonged overuse of the muscle-tendon units in the lower back, usually from incorrect posture.  A single violent injury or force applied to the back. RISK INCREASES WITH:  Sports that involve twisting forces on the spine or a lot of bending at the waist (football, rugby, weightlifting, bowling, golf, tennis, speed skating, racquetball, swimming, running, gymnastics, diving).  Poor strength and flexibility.  Failure to warm up properly before activity.  Family history of lower back pain or disk  disorders.  Previous back injury or surgery (especially fusion).  Poor posture with lifting, especially heavy objects.  Prolonged sitting, especially with poor posture. PREVENTION   Learn and use proper posture when sitting or lifting (maintain proper posture when sitting, lift using the knees and legs, not at the waist).  Warm  up and stretch properly before activity.  Allow for adequate recovery between workouts.  Maintain physical fitness:  Strength, flexibility, and endurance.  Cardiovascular fitness. PROGNOSIS  If treated properly, lower back strains usually heal within 6 weeks. RELATED COMPLICATIONS   Recurring symptoms, resulting in a chronic problem.  Chronic inflammation, scarring, and partial muscle-tendon tear.  Delayed healing or resolution of symptoms.  Prolonged disability. TREATMENT  Treatment first involves the use of ice and medicine, to reduce pain and inflammation. The use of strengthening and stretching exercises may help reduce pain with activity. These exercises may be performed at home or with a therapist. Severe injuries may require referral to a therapist for further evaluation and treatment, such as ultrasound. Your caregiver may advise that you wear a back brace or corset, to help reduce pain and discomfort. Often, prolonged bed rest results in greater harm then benefit. Corticosteroid injections may be recommended. However, these should be reserved for the most serious cases. It is important to avoid using your back when lifting objects. At night, sleep on your back on a firm mattress with a pillow placed under your knees. If non-surgical treatment is unsuccessful, surgery may be needed.  MEDICATION   If pain medicine is needed, nonsteroidal anti-inflammatory medicines (aspirin and ibuprofen), or other minor pain relievers (acetaminophen), are often advised.  Do not take pain medicine for 7 days before surgery.  Prescription pain relievers may be  given, if your caregiver thinks they are needed. Use only as directed and only as much as you need.  Ointments applied to the skin may be helpful.  Corticosteroid injections may be given by your caregiver. These injections should be reserved for the most serious cases, because they may only be given a certain number of times. HEAT AND COLD  Cold treatment (icing) should be applied for 10 to 15 minutes every 2 to 3 hours for inflammation and pain, and immediately after activity that aggravates your symptoms. Use ice packs or an ice massage.  Heat treatment may be used before performing stretching and strengthening activities prescribed by your caregiver, physical therapist, or athletic trainer. Use a heat pack or a warm water soak. SEEK MEDICAL CARE IF:   Symptoms get worse or do not improve in 2 to 4 weeks, despite treatment.  You develop numbness, weakness, or loss of bowel or bladder function.  New, unexplained symptoms develop. (Drugs used in treatment may produce side effects.) EXERCISES  RANGE OF MOTION (ROM) AND STRETCHING EXERCISES - Low Back Strain Most people with lower back pain will find that their symptoms get worse with excessive bending forward (flexion) or arching at the lower back (extension). The exercises which will help resolve your symptoms will focus on the opposite motion.  Your physician, physical therapist or athletic trainer will help you determine which exercises will be most helpful to resolve your lower back pain. Do not complete any exercises without first consulting with your caregiver. Discontinue any exercises which make your symptoms worse until you speak to your caregiver.  If you have pain, numbness or tingling which travels down into your buttocks, leg or foot, the goal of the therapy is for these symptoms to move closer to your back and eventually resolve. Sometimes, these leg symptoms will get better, but your lower back pain may worsen. This is typically an  indication of progress in your rehabilitation. Be very alert to any changes in your symptoms and the activities in which you participated in the 24 hours prior to  the change. Sharing this information with your caregiver will allow him/her to most efficiently treat your condition.  These exercises may help you when beginning to rehabilitate your injury. Your symptoms may resolve with or without further involvement from your physician, physical therapist or athletic trainer. While completing these exercises, remember:  Restoring tissue flexibility helps normal motion to return to the joints. This allows healthier, less painful movement and activity.  An effective stretch should be held for at least 30 seconds.  A stretch should never be painful. You should only feel a gentle lengthening or release in the stretched tissue. FLEXION RANGE OF MOTION AND STRETCHING EXERCISES: STRETCH - Flexion, Single Knee to Chest   Lie on a firm bed or floor with both legs extended in front of you.  Keeping one leg in contact with the floor, bring your opposite knee to your chest. Hold your leg in place by either grabbing behind your thigh or at your knee.  Pull until you feel a gentle stretch in your lower back. Hold __________ seconds.  Slowly release your grasp and repeat the exercise with the opposite side. Repeat __________ times. Complete this exercise __________ times per day.  STRETCH - Flexion, Double Knee to Chest   Lie on a firm bed or floor with both legs extended in front of you.  Keeping one leg in contact with the floor, bring your opposite knee to your chest.  Tense your stomach muscles to support your back and then lift your other knee to your chest. Hold your legs in place by either grabbing behind your thighs or at your knees.  Pull both knees toward your chest until you feel a gentle stretch in your lower back. Hold __________ seconds.  Tense your stomach muscles and slowly return one leg  at a time to the floor. Repeat __________ times. Complete this exercise __________ times per day.  STRETCH - Low Trunk Rotation  Lie on a firm bed or floor. Keeping your legs in front of you, bend your knees so they are both pointed toward the ceiling and your feet are flat on the floor.  Extend your arms out to the side. This will stabilize your upper body by keeping your shoulders in contact with the floor.  Gently and slowly drop both knees together to one side until you feel a gentle stretch in your lower back. Hold for __________ seconds.  Tense your stomach muscles to support your lower back as you bring your knees back to the starting position. Repeat the exercise to the other side. Repeat __________ times. Complete this exercise __________ times per day  EXTENSION RANGE OF MOTION AND FLEXIBILITY EXERCISES: STRETCH - Extension, Prone on Elbows   Lie on your stomach on the floor, a bed will be too soft. Place your palms about shoulder width apart and at the height of your head.  Place your elbows under your shoulders. If this is too painful, stack pillows under your chest.  Allow your body to relax so that your hips drop lower and make contact more completely with the floor.  Hold this position for __________ seconds.  Slowly return to lying flat on the floor. Repeat __________ times. Complete this exercise __________ times per day.  RANGE OF MOTION - Extension, Prone Press Ups  Lie on your stomach on the floor, a bed will be too soft. Place your palms about shoulder width apart and at the height of your head.  Keeping your back as relaxed as possible,  slowly straighten your elbows while keeping your hips on the floor. You may adjust the placement of your hands to maximize your comfort. As you gain motion, your hands will come more underneath your shoulders.  Hold this position __________ seconds.  Slowly return to lying flat on the floor. Repeat __________ times. Complete  this exercise __________ times per day.  RANGE OF MOTION- Quadruped, Neutral Spine   Assume a hands and knees position on a firm surface. Keep your hands under your shoulders and your knees under your hips. You may place padding under your knees for comfort.  Drop your head and point your tail bone toward the ground below you. This will round out your lower back like an angry cat. Hold this position for __________ seconds.  Slowly lift your head and release your tail bone so that your back sags into a large arch, like an old horse.  Hold this position for __________ seconds.  Repeat this until you feel limber in your lower back.  Now, find your "sweet spot." This will be the most comfortable position somewhere between the two previous positions. This is your neutral spine. Once you have found this position, tense your stomach muscles to support your lower back.  Hold this position for __________ seconds. Repeat __________ times. Complete this exercise __________ times per day.  STRENGTHENING EXERCISES - Low Back Strain These exercises may help you when beginning to rehabilitate your injury. These exercises should be done near your "sweet spot." This is the neutral, low-back arch, somewhere between fully rounded and fully arched, that is your least painful position. When performed in this safe range of motion, these exercises can be used for people who have either a flexion or extension based injury. These exercises may resolve your symptoms with or without further involvement from your physician, physical therapist or athletic trainer. While completing these exercises, remember:   Muscles can gain both the endurance and the strength needed for everyday activities through controlled exercises.  Complete these exercises as instructed by your physician, physical therapist or athletic trainer. Increase the resistance and repetitions only as guided.  You may experience muscle soreness or fatigue,  but the pain or discomfort you are trying to eliminate should never worsen during these exercises. If this pain does worsen, stop and make certain you are following the directions exactly. If the pain is still present after adjustments, discontinue the exercise until you can discuss the trouble with your caregiver. STRENGTHENING - Deep Abdominals, Pelvic Tilt  Lie on a firm bed or floor. Keeping your legs in front of you, bend your knees so they are both pointed toward the ceiling and your feet are flat on the floor.  Tense your lower abdominal muscles to press your lower back into the floor. This motion will rotate your pelvis so that your tail bone is scooping upwards rather than pointing at your feet or into the floor.  With a gentle tension and even breathing, hold this position for __________ seconds. Repeat __________ times. Complete this exercise __________ times per day.  STRENGTHENING - Abdominals, Crunches   Lie on a firm bed or floor. Keeping your legs in front of you, bend your knees so they are both pointed toward the ceiling and your feet are flat on the floor. Cross your arms over your chest.  Slightly tip your chin down without bending your neck.  Tense your abdominals and slowly lift your trunk high enough to just clear your shoulder blades. Lifting higher  can put excessive stress on the lower back and does not further strengthen your abdominal muscles.  Control your return to the starting position. Repeat __________ times. Complete this exercise __________ times per day.  STRENGTHENING - Quadruped, Opposite UE/LE Lift   Assume a hands and knees position on a firm surface. Keep your hands under your shoulders and your knees under your hips. You may place padding under your knees for comfort.  Find your neutral spine and gently tense your abdominal muscles so that you can maintain this position. Your shoulders and hips should form a rectangle that is parallel with the floor and  is not twisted.  Keeping your trunk steady, lift your right hand no higher than your shoulder and then your left leg no higher than your hip. Make sure you are not holding your breath. Hold this position __________ seconds.  Continuing to keep your abdominal muscles tense and your back steady, slowly return to your starting position. Repeat with the opposite arm and leg. Repeat __________ times. Complete this exercise __________ times per day.  STRENGTHENING - Lower Abdominals, Double Knee Lift  Lie on a firm bed or floor. Keeping your legs in front of you, bend your knees so they are both pointed toward the ceiling and your feet are flat on the floor.  Tense your abdominal muscles to brace your lower back and slowly lift both of your knees until they come over your hips. Be certain not to hold your breath.  Hold __________ seconds. Using your abdominal muscles, return to the starting position in a slow and controlled manner. Repeat __________ times. Complete this exercise __________ times per day.  POSTURE AND BODY MECHANICS CONSIDERATIONS - Low Back Strain Keeping correct posture when sitting, standing or completing your activities will reduce the stress put on different body tissues, allowing injured tissues a chance to heal and limiting painful experiences. The following are general guidelines for improved posture. Your physician or physical therapist will provide you with any instructions specific to your needs. While reading these guidelines, remember:  The exercises prescribed by your provider will help you have the flexibility and strength to maintain correct postures.  The correct posture provides the best environment for your joints to work. All of your joints have less wear and tear when properly supported by a spine with good posture. This means you will experience a healthier, less painful body.  Correct posture must be practiced with all of your activities, especially prolonged  sitting and standing. Correct posture is as important when doing repetitive low-stress activities (typing) as it is when doing a single heavy-load activity (lifting). RESTING POSITIONS Consider which positions are most painful for you when choosing a resting position. If you have pain with flexion-based activities (sitting, bending, stooping, squatting), choose a position that allows you to rest in a less flexed posture. You would want to avoid curling into a fetal position on your side. If your pain worsens with extension-based activities (prolonged standing, working overhead), avoid resting in an extended position such as sleeping on your stomach. Most people will find more comfort when they rest with their spine in a more neutral position, neither too rounded nor too arched. Lying on a non-sagging bed on your side with a pillow between your knees, or on your back with a pillow under your knees will often provide some relief. Keep in mind, being in any one position for a prolonged period of time, no matter how correct your posture, can still lead  to stiffness. PROPER SITTING POSTURE In order to minimize stress and discomfort on your spine, you must sit with correct posture. Sitting with good posture should be effortless for a healthy body. Returning to good posture is a gradual process. Many people can work toward this most comfortably by using various supports until they have the flexibility and strength to maintain this posture on their own. When sitting with proper posture, your ears will fall over your shoulders and your shoulders will fall over your hips. You should use the back of the chair to support your upper back. Your lower back will be in a neutral position, just slightly arched. You may place a small pillow or folded towel at the base of your lower back for support.  When working at a desk, create an environment that supports good, upright posture. Without extra support, muscles tire, which  leads to excessive strain on joints and other tissues. Keep these recommendations in mind: CHAIR:  A chair should be able to slide under your desk when your back makes contact with the back of the chair. This allows you to work closely.  The chair's height should allow your eyes to be level with the upper part of your monitor and your hands to be slightly lower than your elbows. BODY POSITION  Your feet should make contact with the floor. If this is not possible, use a foot rest.  Keep your ears over your shoulders. This will reduce stress on your neck and lower back. INCORRECT SITTING POSTURES  If you are feeling tired and unable to assume a healthy sitting posture, do not slouch or slump. This puts excessive strain on your back tissues, causing more damage and pain. Healthier options include:  Using more support, like a lumbar pillow.  Switching tasks to something that requires you to be upright or walking.  Talking a brief walk.  Lying down to rest in a neutral-spine position. PROLONGED STANDING WHILE SLIGHTLY LEANING FORWARD  When completing a task that requires you to lean forward while standing in one place for a long time, place either foot up on a stationary 2-4 inch high object to help maintain the best posture. When both feet are on the ground, the lower back tends to lose its slight inward curve. If this curve flattens (or becomes too large), then the back and your other joints will experience too much stress, tire more quickly, and can cause pain. CORRECT STANDING POSTURES Proper standing posture should be assumed with all daily activities, even if they only take a few moments, like when brushing your teeth. As in sitting, your ears should fall over your shoulders and your shoulders should fall over your hips. You should keep a slight tension in your abdominal muscles to brace your spine. Your tailbone should point down to the ground, not behind your body, resulting in an  over-extended swayback posture.  INCORRECT STANDING POSTURES  Common incorrect standing postures include a forward head, locked knees and/or an excessive swayback. WALKING Walk with an upright posture. Your ears, shoulders and hips should all line-up. PROLONGED ACTIVITY IN A FLEXED POSITION When completing a task that requires you to bend forward at your waist or lean over a low surface, try to find a way to stabilize 3 out of 4 of your limbs. You can place a hand or elbow on your thigh or rest a knee on the surface you are reaching across. This will provide you more stability so that your muscles do not  fatigue as quickly. By keeping your knees relaxed, or slightly bent, you will also reduce stress across your lower back. CORRECT LIFTING TECHNIQUES DO :   Assume a wide stance. This will provide you more stability and the opportunity to get as close as possible to the object which you are lifting.  Tense your abdominals to brace your spine. Bend at the knees and hips. Keeping your back locked in a neutral-spine position, lift using your leg muscles. Lift with your legs, keeping your back straight.  Test the weight of unknown objects before attempting to lift them.  Try to keep your elbows locked down at your sides in order get the best strength from your shoulders when carrying an object.  Always ask for help when lifting heavy or awkward objects. INCORRECT LIFTING TECHNIQUES DO NOT:   Lock your knees when lifting, even if it is a small object.  Bend and twist. Pivot at your feet or move your feet when needing to change directions.  Assume that you can safely pick up even a paper clip without proper posture.   This information is not intended to replace advice given to you by your health care provider. Make sure you discuss any questions you have with your health care provider.   Document Released: 11/09/2005 Document Revised: 11/30/2014 Document Reviewed: 02/21/2009 Elsevier  Interactive Patient Education [employer]     I personally performed the services described in this documentation, which was scribed in my presence. The recorded information has been reviewed and considered, and addended by me as needed.

## 2016-02-24 ENCOUNTER — Other Ambulatory Visit: Payer: Self-pay | Admitting: Internal Medicine

## 2016-02-24 DIAGNOSIS — R748 Abnormal levels of other serum enzymes: Secondary | ICD-10-CM

## 2016-03-02 ENCOUNTER — Other Ambulatory Visit: Payer: Self-pay | Admitting: Internal Medicine

## 2016-03-02 ENCOUNTER — Ambulatory Visit
Admission: RE | Admit: 2016-03-02 | Discharge: 2016-03-02 | Disposition: A | Discharge status: Home / Self Care | Payer: BC Managed Care – PPO | Source: Ambulatory Visit | Attending: Internal Medicine | Admitting: Internal Medicine

## 2016-03-02 DIAGNOSIS — R748 Abnormal levels of other serum enzymes: Secondary | ICD-10-CM

## 2016-05-12 DIAGNOSIS — R002 Palpitations: Secondary | ICD-10-CM | POA: Insufficient documentation

## 2016-05-13 ENCOUNTER — Ambulatory Visit (INDEPENDENT_AMBULATORY_CARE_PROVIDER_SITE_OTHER): Payer: BC Managed Care – PPO

## 2016-05-13 DIAGNOSIS — R002 Palpitations: Secondary | ICD-10-CM

## 2016-09-10 ENCOUNTER — Other Ambulatory Visit: Payer: Self-pay | Admitting: Internal Medicine

## 2016-09-10 DIAGNOSIS — Z1231 Encounter for screening mammogram for malignant neoplasm of breast: Secondary | ICD-10-CM

## 2016-10-12 ENCOUNTER — Ambulatory Visit
Admission: RE | Admit: 2016-10-12 | Discharge: 2016-10-12 | Disposition: A | Discharge status: Home / Self Care | Payer: BC Managed Care – PPO | Source: Ambulatory Visit | Attending: Internal Medicine | Admitting: Internal Medicine

## 2016-10-12 DIAGNOSIS — Z1231 Encounter for screening mammogram for malignant neoplasm of breast: Secondary | ICD-10-CM

## 2017-03-12 ENCOUNTER — Other Ambulatory Visit: Payer: Self-pay | Admitting: Internal Medicine

## 2017-03-12 ENCOUNTER — Other Ambulatory Visit (HOSPITAL_COMMUNITY)
Admission: RE | Admit: 2017-03-12 | Discharge: 2017-03-12 | Disposition: A | Discharge status: Home / Self Care | Payer: BC Managed Care – PPO | Source: Ambulatory Visit | Attending: Internal Medicine | Admitting: Internal Medicine

## 2017-03-12 DIAGNOSIS — Z01419 Encounter for gynecological examination (general) (routine) without abnormal findings: Secondary | ICD-10-CM | POA: Insufficient documentation

## 2017-03-16 LAB — CYTOLOGY - PAP: DIAGNOSIS: NEGATIVE

## 2017-08-04 ENCOUNTER — Emergency Department (HOSPITAL_COMMUNITY)
Admission: EM | Admit: 2017-08-04 | Discharge: 2017-08-04 | Disposition: A | Discharge status: Home / Self Care | Payer: BC Managed Care – PPO | Attending: Emergency Medicine | Admitting: Emergency Medicine

## 2017-08-04 ENCOUNTER — Encounter (HOSPITAL_COMMUNITY): Payer: Self-pay

## 2017-08-04 ENCOUNTER — Emergency Department (HOSPITAL_COMMUNITY): Payer: BC Managed Care – PPO

## 2017-08-04 DIAGNOSIS — K529 Noninfective gastroenteritis and colitis, unspecified: Secondary | ICD-10-CM | POA: Diagnosis not present

## 2017-08-04 DIAGNOSIS — Z7984 Long term (current) use of oral hypoglycemic drugs: Secondary | ICD-10-CM | POA: Diagnosis not present

## 2017-08-04 DIAGNOSIS — E119 Type 2 diabetes mellitus without complications: Secondary | ICD-10-CM | POA: Diagnosis not present

## 2017-08-04 DIAGNOSIS — I10 Essential (primary) hypertension: Secondary | ICD-10-CM | POA: Diagnosis not present

## 2017-08-04 DIAGNOSIS — Z7982 Long term (current) use of aspirin: Secondary | ICD-10-CM | POA: Insufficient documentation

## 2017-08-04 DIAGNOSIS — Z79899 Other long term (current) drug therapy: Secondary | ICD-10-CM | POA: Diagnosis not present

## 2017-08-04 DIAGNOSIS — K625 Hemorrhage of anus and rectum: Secondary | ICD-10-CM | POA: Diagnosis present

## 2017-08-04 LAB — TYPE AND SCREEN
ABO/RH(D): O NEG
Antibody Screen: NEGATIVE

## 2017-08-04 LAB — COMPREHENSIVE METABOLIC PANEL
ALT: 32 U/L (ref 14–54)
AST: 30 U/L (ref 15–41)
Albumin: 3.7 g/dL (ref 3.5–5.0)
Alkaline Phosphatase: 74 U/L (ref 38–126)
Anion gap: 10 (ref 5–15)
BUN: 14 mg/dL (ref 6–20)
CALCIUM: 9.1 mg/dL (ref 8.9–10.3)
CO2: 29 mmol/L (ref 22–32)
CREATININE: 0.88 mg/dL (ref 0.44–1.00)
Chloride: 97 mmol/L — ABNORMAL LOW (ref 101–111)
GFR calc non Af Amer: >60 mL/min (ref >60)
Glucose, Bld: 169 mg/dL — ABNORMAL HIGH (ref 65–99)
Potassium: 3.7 mmol/L (ref 3.5–5.1)
SODIUM: 136 mmol/L (ref 135–145)
Total Bilirubin: 0.8 mg/dL (ref 0.3–1.2)
Total Protein: 7.9 g/dL (ref 6.5–8.1)

## 2017-08-04 LAB — CBC
HCT: 49.6 % — ABNORMAL HIGH (ref 36.0–46.0)
Hemoglobin: 16.9 g/dL — ABNORMAL HIGH (ref 12.0–15.0)
MCH: 30.3 pg (ref 26.0–34.0)
MCHC: 34.1 g/dL (ref 30.0–36.0)
MCV: 88.9 fL (ref 78.0–100.0)
Platelets: 223 10*3/uL (ref 150–400)
RBC: 5.58 MIL/uL — AB (ref 3.87–5.11)
RDW: 12.8 % (ref 11.5–15.5)
WBC: 16 10*3/uL — ABNORMAL HIGH (ref 4.0–10.5)

## 2017-08-04 LAB — URINALYSIS, ROUTINE W REFLEX MICROSCOPIC
Bilirubin Urine: NEGATIVE
KETONES UR: 20 mg/dL — AB
Nitrite: NEGATIVE
PH: 5 (ref 5.0–8.0)
PROTEIN: 100 mg/dL — AB
Specific Gravity, Urine: 1.031 — ABNORMAL HIGH (ref 1.005–1.030)

## 2017-08-04 LAB — POC OCCULT BLOOD, ED: FECAL OCCULT BLD: POSITIVE — AB

## 2017-08-04 LAB — LIPASE, BLOOD: Lipase: 26 U/L (ref 11–51)

## 2017-08-04 LAB — ABO/RH: ABO/RH(D): O NEG

## 2017-08-04 MED ORDER — ONDANSETRON 4 MG PO TBDP: 4.0000 mg | ORAL_TABLET | Freq: Three times a day (TID) | ORAL | 0 refills | Status: AC | PRN

## 2017-08-04 MED ORDER — ONDANSETRON HCL 4 MG/2ML IJ SOLN
4.0000 mg | Freq: Once | INTRAMUSCULAR | Status: AC
  Administered 2017-08-04: 4 mg via INTRAVENOUS
  Filled 2017-08-04: qty 2

## 2017-08-04 MED ORDER — METRONIDAZOLE 500 MG PO TABS: 500.0000 mg | ORAL_TABLET | Freq: Two times a day (BID) | ORAL | 0 refills | Status: AC

## 2017-08-04 MED ORDER — SODIUM CHLORIDE 0.9 % IV BOLUS (SEPSIS)
1000.0000 mL | Freq: Once | INTRAVENOUS | Status: AC
  Administered 2017-08-04: 1000 mL via INTRAVENOUS

## 2017-08-04 MED ORDER — CIPROFLOXACIN HCL 500 MG PO TABS: 500.0000 mg | ORAL_TABLET | Freq: Two times a day (BID) | ORAL | 0 refills | Status: AC

## 2017-08-04 MED ORDER — IOPAMIDOL (ISOVUE-300) INJECTION 61%
INTRAVENOUS | Status: AC
Start: 2017-08-04 — End: 2017-08-04
  Administered 2017-08-04: 100 mL
  Filled 2017-08-04: qty 100

## 2017-08-04 NOTE — ED Notes (Signed)
Attempted IV stick x 2 unsuccessfully.  

## 2017-08-04 NOTE — ED Notes (Signed)
Pt given diet gingerale for fluid challenge. Pt tolerating well. Will continue to monitor.

## 2017-08-04 NOTE — ED Triage Notes (Signed)
Per Pt, Pt is coming from PCP with complaints of GI Bleed that started yesterday. Pt had lower abdominal pain and diarrhea that changed into bloody diarrhea late last night. Denies any urinary symptoms. Vomiting present and pain with eating and drinking.

## 2017-08-04 NOTE — ED Notes (Signed)
Pt ambulatory at d/c with prescriptions and all belongings.

## 2017-08-04 NOTE — Discharge Instructions (Signed)
You were evaluated in the ED for bloody diarrhea, abdominal cramping, nausea and vomiting. The CT scan showed inflammation of your transverse colon. This is most likely from an infection.   Please take antibiotics as prescribed. Use zofran for nausea. Advance your diet slowly, start with clear liquids for at least 24-48 hours. After that, you can eat small bland meals every 4-6 hours. Avoid greasy, fatty or spicy foods.   Drink small amounts of clear fluids throughout the day to stay hydrated.   Your symptoms should start to improve within 3-4 days of antibiotics.   Return to fevers, chills, worsening abdominal pain, uncontrollable nausea, vomiting, diarrhea, light-headedness, chest pain, shortness of breath or feeling like passing out.  Do not use any anti-diarrhea medications.

## 2017-08-04 NOTE — ED Provider Notes (Signed)
[facility]-EMERGENCY DEPT Provider Note   CSN: [id number] Arrival date & time: 08/04/17  1108     History   Chief Complaint Chief Complaint  Patient presents with  . GI Bleeding    HPI Jocelyn Meyer is a 61 y.o. female with history of non-insulin-dependent diabetes, obesity presents to ED from PCP office for evaluation of bright red diarrhea that began last night. No pus. Associated with constant diffuse lower abdominal cramping, nausea and nonbloody nonbilious emesis. Reports 1 episode of bloody diarrhea in January 2018 that resolved on its own, she did not seek medical attention for this. Denies history of PUD, IBD, IBS, diverticulosis, diverticulitis.Occasional EtOH and NSAID use. Reports previous history of uncomplicated hemorrhoids. Not on any anticoagulants. Denies fevers, chills, chest pain, shortness of breath, lightheadedness, dysuria or frequency. No recent travel. Started victoza 2 weeks ago, but no other med changes. Noticed urine this morning was darker and had stronger odor.   HPI  Past Medical History:  Diagnosis Date  . Arthritis   . Diabetes mellitus without complication (HCC)   . Glaucoma   . Hypertension   . Obesity     Patient Active Problem List   Diagnosis Date Noted  . Palpitations 05/12/2016  . Diabetes (HCC) 04/02/2014    Past Surgical History:  Procedure Laterality Date  . KNEE SURGERY    . TUBAL LIGATION    . WISDOM TOOTH EXTRACTION    . WRIST SURGERY      OB History    No data available       Home Medications    Prior to Admission medications   Medication Sig Start Date End Date Taking? Authorizing Provider  aspirin 81 MG tablet Take 81 mg by mouth daily. Reported on 12/25/2015   Yes [provider]  diclofenac (VOLTAREN) 50 MG EC tablet Take 50 mg by mouth 2 (two) times daily.   Yes [provider]  FARXIGA 10 MG TABS tablet Take 10 mg by mouth daily. 07/28/17  Yes [provider]  glipiZIDE (GLUCOTROL) 5 MG tablet  Take 5 mg by mouth 2 (two) times daily. 07/28/17  Yes [provider]  Glucosamine 500 MG CAPS Take 1,000 mg by mouth. Reported on 12/25/2015   Yes [provider]  latanoprost (XALATAN) 0.005 % ophthalmic solution 1 drop at bedtime.   Yes [provider]  metFORMIN (GLUCOPHAGE) 500 MG tablet Take 250 mg by mouth 2 (two) times daily with a meal.    Yes [provider]  Multiple Vitamins-Minerals (MULTIVITAMIN WITH MINERALS) tablet Take 1 tablet by mouth daily.   Yes [provider]  ondansetron (ZOFRAN) 4 MG tablet Take 4 mg by mouth as needed for nausea/vomiting. 07/29/17  Yes [provider]  timolol (BETIMOL) 0.25 % ophthalmic solution Place 1-2 drops into both eyes daily.    Yes [provider]  triamterene-hydrochlorothiazide (DYAZIDE) 37.5-25 MG per capsule Take 1 capsule by mouth every morning.   Yes [provider]  VICTOZA 18 MG/3ML SOPN Inject 1.2 mg into the skin daily. 07/20/17  Yes [provider]  ciprofloxacin (CIPRO) 500 MG tablet Take 1 tablet (500 mg total) by mouth every 12 (twelve) hours. 08/04/17 08/14/17  Liberty Handy, PA-C  cyclobenzaprine (FLEXERIL) 5 MG tablet 1/2 to 1 pill by mouth up to every 8 hours as needed. Start with one pill by mouth each bedtime as needed due to sedation Patient not taking: Reported on 08/04/2017 12/25/15   Shade Flood, MD  metroNIDAZOLE (FLAGYL) 500 MG tablet Take 1 tablet (500 mg total) by mouth 2 (two) times daily. 08/04/17 08/14/17  Liberty HandyGibbons, Zavier Canela J, PA-C  ondansetron (ZOFRAN ODT) 4 MG disintegrating tablet Take 1 tablet (4 mg total) by mouth every 8 (eight) hours as needed for nausea or vomiting. 08/04/17   Liberty HandyGibbons, Lean Fayson J, PA-C    Family History Family History  Problem Relation Age of Onset  . Hypertension Mother   . Diabetes Father   . Hypertension Father   . Hypertension Sister   . Diabetes Brother   . Hypertension Brother   . Diabetes Sister   .  Hypertension Sister   . Diabetes Brother   . Hypertension Brother   . Asthma Other   . Hypertension Other   . Diabetes Other     Social History Social History  Substance Use Topics  . Smoking status: Never Smoker  . Smokeless tobacco: Never Used  . Alcohol use No     Allergies   Other and Penicillins   Review of Systems Review of Systems  Constitutional: Positive for appetite change. Negative for chills and fever.  Respiratory: Negative for shortness of breath.   Cardiovascular: Negative for chest pain.  Gastrointestinal: Positive for abdominal pain, blood in stool, diarrhea, nausea and vomiting. Negative for rectal pain.  Genitourinary: Negative for difficulty urinating, dysuria and hematuria.  Allergic/Immunologic: Positive for immunocompromised state.  Neurological: Negative for light-headedness.     Physical Exam Updated Vital Signs BP (!) 144/63   Pulse (!) 58   Temp 98.8 F (37.1 C) (Oral)   Resp 14   Ht 5\' 4"  (1.626 m)   Wt 95.3 kg (210 lb)   LMP 02/23/2012   SpO2 95%   BMI 36.05 kg/m   Physical Exam  Constitutional: She is oriented to person, place, and time. She appears well-developed and well-nourished. No distress.  Non toxic appearing  HENT:  Head: Normocephalic and atraumatic.  Nose: Nose normal.  Mouth/Throat: No oropharyngeal exudate.  Top of the tongue is dry, buccal mucosa is moist  Eyes: Pupils are equal, round, and reactive to light. Conjunctivae and EOM are normal.  Neck: Normal range of motion. Neck supple.  Cardiovascular: Normal rate, regular rhythm, normal heart sounds and intact distal pulses.   No murmur heard. Pulmonary/Chest: Effort normal and breath sounds normal. She exhibits no tenderness.  Abdominal: Soft. Bowel sounds are normal. She exhibits no distension. There is tenderness.  Diffuse lower abdominal tenderness without guarding, rigidity or rebound Mild suprapubic tenderness No CVA tenderness  Genitourinary: Rectal  exam shows guaiac positive stool.  Genitourinary Comments: Chaperone was present.  I was able to palpate the first 2-3cm of the rectum digitally  Small non tendern internal hemorrhoid at 6 o'clock No external fissures or external hemorrhoids noted.  No induration or swelling of the perianal skin.   No stool on gloved finger No signs of perirectal abscess.   DRE reveals good sphincter tone.    Musculoskeletal: Normal range of motion. She exhibits no deformity.  Lymphadenopathy:    She has no cervical adenopathy.  Neurological: She is alert and oriented to person, place, and time. No sensory deficit.  Skin: Skin is warm and dry. Capillary refill takes less than 2 seconds.  Psychiatric: She has a normal mood and affect. Her behavior is normal. Judgment and thought content normal.  Nursing note and vitals reviewed.    ED Treatments / Results  Labs (all labs ordered are listed, but only abnormal results are  displayed) Labs Reviewed  COMPREHENSIVE METABOLIC PANEL - Abnormal; Notable for the following:       Result Value   Chloride 97 (*)    Glucose, Bld 169 (*)    All other components within normal limits  CBC - Abnormal; Notable for the following:    WBC 16.0 (*)    RBC 5.58 (*)    Hemoglobin 16.9 (*)    HCT 49.6 (*)    All other components within normal limits  URINALYSIS, ROUTINE W REFLEX MICROSCOPIC - Abnormal; Notable for the following:    Color, Urine AMBER (*)    APPearance CLOUDY (*)    Specific Gravity, Urine 1.031 (*)    Glucose, UA >=500 (*)    Hgb urine dipstick MODERATE (*)    Ketones, ur 20 (*)    Protein, ur 100 (*)    Leukocytes, UA MODERATE (*)    Bacteria, UA RARE (*)    Squamous Epithelial / LPF 6-30 (*)    Non Squamous Epithelial 0-5 (*)    All other components within normal limits  POC OCCULT BLOOD, ED - Abnormal; Notable for the following:    Fecal Occult Bld POSITIVE (*)    All other components within normal limits  LIPASE, BLOOD  POC OCCULT BLOOD,  ED  TYPE AND SCREEN  ABO/RH    EKG  EKG Interpretation None       Radiology Ct Abdomen Pelvis W Contrast  Result Date: 08/04/2017 CLINICAL DATA:  Lower abdominal pain, vomiting EXAM: CT ABDOMEN AND PELVIS WITH CONTRAST TECHNIQUE: Multidetector CT imaging of the abdomen and pelvis was performed using the standard protocol following bolus administration of intravenous contrast. CONTRAST:  ISOVUE-300 IOPAMIDOL (ISOVUE-300) INJECTION 61% COMPARISON:  None. FINDINGS: Lower chest: Lung bases are clear. No effusions. Heart is normal size. Hepatobiliary: Fatty infiltration of the liver. No focal abnormality or biliary ductal dilatation. Gallbladder unremarkable. Pancreas: No focal abnormality or ductal dilatation. Spleen: No focal abnormality.  Normal size. Adrenals/Urinary Tract: No adrenal abnormality. No focal renal abnormality. No stones or hydronephrosis. Urinary bladder is unremarkable. Stomach/Bowel: Severe colonic wall thickening within the transverse colon and proximal descending colon compatible with colitis. Remainder of the colon is unremarkable except for scattered sigmoid diverticula. Stomach and small bowel decompressed. No bowel obstruction. Vascular/Lymphatic: No evidence of aneurysm or adenopathy. Reproductive: Uterus and adnexa unremarkable.  No mass. Other: Trace free fluid in the pelvis.  No free air. Musculoskeletal: No acute bony abnormality. IMPRESSION: Marked colonic wall thickening in the transverse colon and proximal descending colon compatible with infectious or inflammatory colitis. Diffuse fatty infiltration of the liver. Sigmoid diverticulosis. Electronically Signed   By: Charlett Nose M.D.   On: 08/04/2017 18:48    Procedures Procedures (including critical care time)  Medications Ordered in ED Medications  sodium chloride 0.9 % bolus 1,000 mL (1,000 mLs Intravenous New Bag/Given 08/04/17 1751)  ondansetron (ZOFRAN) injection 4 mg (4 mg Intravenous Given 08/04/17  1728)  sodium chloride 0.9 % bolus 1,000 mL (1,000 mLs Intravenous New Bag/Given 08/04/17 1728)  iopamidol (ISOVUE-300) 61 % injection (100 mLs  Contrast Given 08/04/17 1756)     Initial Impression / Assessment and Plan / ED Course  I have reviewed the triage vital signs and the nursing notes.  Pertinent labs & imaging results that were available during my care of the patient were reviewed by me and considered in my medical decision making (see chart for details).  Clinical Course as of Aug 04 2018  Wed  Aug 04, 2017  1606 WBC: (!) 16.0 [CG]  1606 Hemoglobin: (!) 16.9 [CG]  1606 Hgb urine dipstick: (!) MODERATE [CG]  1607 Leukocytes, UA: (!) MODERATE [CG]  1607 WBC, UA: TOO NUMEROUS TO COUNT [CG]  1607 Squamous Epithelial / LPF: (!) 6-30 [CG]  1607 Nitrite: NEGATIVE [CG]  1927 IMPRESSION: Marked colonic wall thickening in the transverse colon and proximal descending colon compatible with infectious or inflammatory colitis.  Diffuse fatty infiltration of the liver.  Sigmoid diverticulosis. CT Abdomen Pelvis W Contrast [CG]  1939 Re-evaluated patient. She is feeling better, mild abdominal discomfort still presents. No nausea. Discussed CTAP results. Discussed d/c vs admission. She is comfortable with discharge at this time. Will try PO challenge.   [CG]    Clinical Course User Index [CG] Liberty Handy, PA-C   61 year old female presents with acute bright GI bleed onset yesterday a/w diffuse abdominal cramping, nausea and NBNB emesis. No fevers or chills. She is non toxic appearing. Has non focal lower abdominal tenderness, including suprapubic, without signs of peritonitis. Lab work today shows leukocytosis WBC 16. Normal H/H. Urinalysis with UTI. Electrolytes mostly normal. LFT, lipase and creatinine WNL.  +Hemoccult. Will give IVF, zofran, morphine and order CT A/P. Considering diverticular bleed or other lower GI source. No previous h/o PUD, IBD, diverticulosis. Cannot find  colonoscopy on chart.   CT shows marked transvere colitis and diverticulosis.  Upon re-evaluation pt felt better, no BM since in ED. Will try PO challenge. She has adequate PCP established, already has stool cards for culture with her. Discussed d/c vs admission, she is comfortable with d/c. Will d/c with antibiotics, anti-emetics, gentle diet and f/u with PCP in 2-3 days for re-eval. She is aware of s/s that would warrant return to ED.   Final Clinical Impressions(s) / ED Diagnoses   Final diagnoses:  Acute colitis    New Prescriptions New Prescriptions   CIPROFLOXACIN (CIPRO) 500 MG TABLET    Take 1 tablet (500 mg total) by mouth every 12 (twelve) hours.   METRONIDAZOLE (FLAGYL) 500 MG TABLET    Take 1 tablet (500 mg total) by mouth 2 (two) times daily.   ONDANSETRON (ZOFRAN ODT) 4 MG DISINTEGRATING TABLET    Take 1 tablet (4 mg total) by mouth every 8 (eight) hours as needed for nausea or vomiting.     Liberty Handy, PA-C 08/04/17 2019    Maia Plan, MD 08/05/17 [phone]

## 2018-04-12 ENCOUNTER — Other Ambulatory Visit: Payer: Self-pay | Admitting: Internal Medicine

## 2018-04-12 DIAGNOSIS — Z1231 Encounter for screening mammogram for malignant neoplasm of breast: Secondary | ICD-10-CM

## 2018-05-10 ENCOUNTER — Ambulatory Visit
Admission: RE | Admit: 2018-05-10 | Discharge: 2018-05-10 | Disposition: A | Discharge status: Home / Self Care | Payer: BC Managed Care – PPO | Source: Ambulatory Visit | Attending: Internal Medicine | Admitting: Internal Medicine

## 2018-05-10 DIAGNOSIS — Z1231 Encounter for screening mammogram for malignant neoplasm of breast: Secondary | ICD-10-CM

## 2018-05-11 ENCOUNTER — Encounter: Payer: BC Managed Care – PPO | Attending: Internal Medicine | Admitting: *Deleted

## 2018-05-11 DIAGNOSIS — Z713 Dietary counseling and surveillance: Secondary | ICD-10-CM | POA: Diagnosis not present

## 2018-05-11 DIAGNOSIS — E119 Type 2 diabetes mellitus without complications: Secondary | ICD-10-CM | POA: Insufficient documentation

## 2018-05-11 DIAGNOSIS — E118 Type 2 diabetes mellitus with unspecified complications: Secondary | ICD-10-CM

## 2018-05-11 NOTE — Patient Instructions (Signed)
Plan:  Continue to aim for 2 Carb Choices per meal (30 grams) +/- 1 either way  Aim for 0-1 Carbs per snack if hungry  Include protein in moderation with your meals and snacks Continue reading food labels for Total Carbohydrate of foods Consider  increasing your activity level by doing Arm Chair Exercises for 5-10 minutes 1-3 times daily as tolerated Continue checking BG at alternate times per day   Continue taking medication as directed by MD

## 2018-05-12 NOTE — Progress Notes (Signed)
Diabetes Self-Management Education  Visit Type:  First/Initial  Appt. Start Time: 0800 Appt. End Time: 0930  05/12/2018  Ms. Maryland Stell, identified by name and date of birth, is a 62 y.o. female with a diagnosis of Diabetes: Type 2.  She attended Comprehensive Diabetes Class in 2016 but is in need of a review. She works in [employer] and her job is fairly stressful. She recently was moved to the [employer] so it is a different work environment with some uncertainty as to how long she will be there. Due to working full time and having some right shoulder pain, she has not been getting any exercise.   ASSESSMENT  Height 5\' 4"  (1.626 m), weight 213 lb 1.6 oz (96.7 kg), last menstrual period 02/23/2012. Body mass index is 36.58 kg/m.   Diabetes Self-Management Education - 05/11/18 0805      Initial Visit   What type of meal plan do you follow?  lower carb and sugars      Health Coping   How would you rate your overall health?  Good      Psychosocial Assessment   Patient Belief/Attitude about Diabetes  Defeat/Burnout    Self-care barriers  None    Self-management support  Family    Patient Concerns  Nutrition/Meal planning;Weight Control;Glycemic Control    Special Needs  None    Learning Readiness  Change in progress      Pre-Education Assessment   Patient understands the diabetes disease and treatment process.  Demonstrates understanding / competency    Patient understands incorporating nutritional management into lifestyle.  Demonstrates understanding / competency    Patient undertands incorporating physical activity into lifestyle.  Needs Review    Patient understands using medications safely.  Demonstrates understanding / competency    Patient understands monitoring blood glucose, interpreting and using results  Demonstrates understanding / competency    Patient understands prevention, detection, and treatment of chronic complications.  Demonstrates understanding  / competency    Patient understands how to develop strategies to address psychosocial issues.  Needs Review    Patient understands how to develop strategies to promote health/change behavior.  Needs Review      Complications   Last HgB A1C per patient/outside source  6.7 %    How often do you check your blood sugar?  1-2 times/day    Number of hypoglycemic episodes per month  3    Have you had a dilated eye exam in the past 12 months?  Yes    Have you had a dental exam in the past 12 months?  No    Are you checking your feet?  Yes    How many days per week are you checking your feet?  7      Dietary Intake   Breakfast  2 whole wheat toast with PNB on each    Snack (morning)  to prevent low BG she has 4-5 crackers with colby jack cheese    Lunch  brings from home: lean meat or banana sandwich with mayo, handful of cheese doodles OR left overs from supper    Snack (afternoon)  occasionally 2 small bite sized snicker bars if stressed OR crackers and cheese    Dinner  lean meat, 1/2 cup starch and usually vegetables     Beverage(s)  coffee with amaretta cream, diet soda, water,       Exercise   Exercise Type  ADL's      Patient Education  Previous Diabetes Education  Yes (please comment) 3 years ago    Nutrition management   Carbohydrate counting;Reviewed blood glucose goals for pre and post meals and how to evaluate the patients' food intake on their blood glucose level.    Physical activity and exercise   Role of exercise on diabetes management, blood pressure control and cardiac health.;Helped patient identify appropriate exercises in relation to his/her diabetes, diabetes complications and other health issue.    Medications  Reviewed patients medication for diabetes, action, purpose, timing of dose and side effects.    Acute complications  Taught treatment of hypoglycemia - the 15 rule.    Chronic complications  Relationship between chronic complications and blood glucose control     Psychosocial adjustment  Helped patient identify a support system for diabetes management;Role of stress on diabetes      Individualized Goals (developed by patient)   Nutrition  Follow meal plan discussed    Physical Activity  Exercise 3-5 times per week    Medications  take my medication as prescribed    Monitoring   test blood glucose pre and post meals as discussed      Post-Education Assessment   Patient understands the diabetes disease and treatment process.  Demonstrates understanding / competency    Patient understands incorporating nutritional management into lifestyle.  Demonstrates understanding / competency    Patient undertands incorporating physical activity into lifestyle.  Demonstrates understanding / competency    Patient understands using medications safely.  Demonstrates understanding / competency    Patient understands monitoring blood glucose, interpreting and using results  Demonstrates understanding / competency    Patient understands prevention, detection, and treatment of acute complications.  Demonstrates understanding / competency    Patient understands prevention, detection, and treatment of chronic complications.  Demonstrates understanding / competency    Patient understands how to develop strategies to address psychosocial issues.  Demonstrates understanding / competency    Patient understands how to develop strategies to promote health/change behavior.  Demonstrates understanding / competency      Outcomes   Program Status  Completed      Learning Objective:  Patient will have a greater understanding of diabetes self-management. Patient education plan is to attend individual and/or group sessions per assessed needs and concerns.  Plan:   Patient Instructions  Plan:  Continue to aim for 2 Carb Choices per meal (30 grams) +/- 1 either way  Aim for 0-1 Carbs per snack if hungry  Include protein in moderation with your meals and snacks Continue reading  food labels for Total Carbohydrate of foods Consider  increasing your activity level by doing Arm Chair Exercises for 5-10 minutes 1-3 times daily as tolerated Continue checking BG at alternate times per day   Continue taking medication as directed by MD  Expected Outcomes:  Demonstrated interest in learning. Expect positive outcomes  Education material provided: Meal plan card and Carbohydrate counting sheet [facility] [place], Arm Chair Exercise handout  If problems or questions, patient to contact team via:  Phone  Future DSME appointment: - 4-6 wks

## 2018-06-15 ENCOUNTER — Encounter: Payer: BC Managed Care – PPO | Attending: Internal Medicine | Admitting: *Deleted

## 2018-06-15 DIAGNOSIS — E119 Type 2 diabetes mellitus without complications: Secondary | ICD-10-CM | POA: Insufficient documentation

## 2018-06-15 DIAGNOSIS — Z713 Dietary counseling and surveillance: Secondary | ICD-10-CM | POA: Diagnosis not present

## 2018-06-15 DIAGNOSIS — E118 Type 2 diabetes mellitus with unspecified complications: Secondary | ICD-10-CM

## 2018-06-15 NOTE — Patient Instructions (Signed)
Plan:  Continue to look into classes at [facility] that may be convenient for you to get to, either from work or from home. You mentioned you were interested in Water Exercises, which I strongly support  You continue to make appropriate choices with your food most of the time, good job.  I'd like you to start recognizing the positive decisions you make every day in terms of your food choices, diabetes management decisions, and the management of your life as well. I feel that you deserve to give yourself credit for all of the appropriate decisions you make for yourself every day. Perfection is not an obtainable goal!  Congratulations on a great A1c of 6.7%

## 2018-06-15 NOTE — Progress Notes (Signed)
Diabetes Self-Management Education  Visit Type:    Follow up  Appt. Start Time: 0810 Appt. End Time: 0840  06/15/2018  [name], identified by name and date of birth, is a 62 y.o. female with a diagnosis of Diabetes:  Per our last visit, she attended Comprehensive Diabetes Class in 2016 but is in need of a review. She works in [employer] and her job is fairly stressful. [place] so it is a different work environment with some uncertainty as to how long she will be there.  She states today that she does not feel she has accomplished anything, but after further conversation, she has walked several times since our last visit a month ago. She continues to follow a modified carb and low fat diet most of the time. She has also started her research on the [facility] in the area and plans to continue so she can find out what specific classes are available. She expresses interest in water exercises. Her last A1c was 6.7% so her diabetes is considered under control. She also states she has not had amy more problems with hypoglycemia. She states she had allergic reaction at the site with Trulicity, so her MD has changed the Rx to Ozempic, which she will take her first dose of this coming Saturday.  ASSESSMENT  Weight 212 lb 8 oz (96.4 kg),  Learning Objective:  Patient will have a greater understanding of diabetes self-management. Patient education plan is to attend individual and/or group sessions per assessed needs and concerns.  Plan:   Patient Instructions  Plan:  Continue to look into classes at [facility] that may be convenient for you to get to, either from work or from home. You mentioned you were interested in Water Exercises, which I strongly support  You continue to make appropriate choices with your food most of the time, good job.  I'd like you to start recognizing the positive decisions you make every day in terms of your food choices,  diabetes management decisions, and the management of your life as well. I feel that you deserve to give yourself credit for all of the appropriate decisions you make for yourself every day. Perfection is not an obtainable goal!  Congratulations on a great A1c of 6.7%   Expected Outcomes:     Secretary/administratorducation material provided: List of [facility] [place],   If problems or questions, patient to contact team via:  Phone  Future DSME appointment: -   6 weeks

## 2018-07-27 ENCOUNTER — Encounter: Payer: BC Managed Care – PPO | Attending: Internal Medicine | Admitting: *Deleted

## 2018-07-27 DIAGNOSIS — E118 Type 2 diabetes mellitus with unspecified complications: Secondary | ICD-10-CM

## 2018-07-27 DIAGNOSIS — Z713 Dietary counseling and surveillance: Secondary | ICD-10-CM | POA: Diagnosis not present

## 2018-07-27 DIAGNOSIS — E119 Type 2 diabetes mellitus without complications: Secondary | ICD-10-CM | POA: Diagnosis not present

## 2018-07-27 NOTE — Progress Notes (Signed)
Diabetes Self-Management Education  Visit Type:  Follow-up  Appt. Start Time: 0800 Appt. End Time: 0830  07/27/2018  [name], identified by name and date of birth, is a 62 y.o. female with a diagnosis of Diabetes: Type 2.  She states she has started water exercises at [facility] on Tuesday and Thursday on her way home from work, and she really likes it! No joint pain while in the water and she saw a drop of 60 points in her BG after one of these classes! Her fasting BG remain elevated but she is not eating during the night, so probably due to [facility], or insulin resistance in the early AM. I plan to discuss the idea of basal insulin to help with this problem.   ASSESSMENT  Weight 211 lb 1.6 oz (95.8 kg), last menstrual period 02/23/2012. Body mass index is 36.24 kg/m.   Diabetes Self-Management Education - 07/27/18 1331      Initial Visit   What type of meal plan do you follow?  aiming for about 3 carb choices per meal and up to 2 per snack      Health Coping   How would you rate your overall health?  Good      Complications   How often do you check your blood sugar?  3-4 times/day    Fasting Blood glucose range (mg/dL)  528-413;>244    Postprandial Blood glucose range (mg/dL)  01-027;253-664    Number of hypoglycemic episodes per month  0      Exercise   Exercise Type  Light (walking / raking leaves)   water exercises 2 days a week now!   How many days per week to you exercise?  2    How many minutes per day do you exercise?  60    Total minutes per week of exercise  120      Patient Education   Previous Diabetes Education  Yes (please comment)      Individualized Goals (developed by patient)   Nutrition  Follow meal plan discussed    Physical Activity  Exercise 3-5 times per week    Medications  take my medication as prescribed    Monitoring   test blood glucose pre and post meals as discussed      Patient Self-Evaluation of Goals - Patient rates  self as meeting previously set goals (% of time)   Nutrition  >75%    Physical Activity  >75%    Medications  >75%    Monitoring  >75%    Problem Solving  >75%    Reducing Risk  >75%    Health Coping  >75%      Outcomes   Program Status  Completed      Subsequent Visit   Since your last visit have you continued or begun to take your medications as prescribed?  Yes    Since your last visit have you experienced any weight changes?  Loss    Weight Loss (lbs)  3    Since your last visit, are you checking your blood glucose at least once a day?  Yes       Learning Objective:  Patient will have a greater understanding of diabetes self-management. Patient education plan is to attend individual and/or group sessions per assessed needs and concerns.   Plan:   Patient Instructions  Plan:  Congratulations on starting the classes at [facility] [place] on Tuesday and Thursday!   You continue to  make appropriate choices with your food most of the time, good job.  I showed you the [employer] Freestyle Libre CGM brochure today, follow up with your insurance to check on the coverage for it.  I'd like you to continue recognizing the positive decisions you make every day in terms of your food choices, diabetes management decisions, and the management of your life as well. I feel that you deserve to give yourself credit for all of the appropriate decisions you make for yourself every day. Perfection is not an obtainable goal!  Congratulations on a great A1c of 6.7%   Expected Outcomes:  Demonstrated interest in learning. Expect positive outcomes  Education material provided: A1C conversion sheet, Diabetes medication handout, insulin action handout  If problems or questions, patient to contact team via:  Phone  Future DSME appointment: - PRN

## 2018-07-27 NOTE — Patient Instructions (Addendum)
Plan:  Congratulations on starting the classes at [facility] [place] on Tuesday and Thursday!   You continue to make appropriate choices with your food most of the time, good job.  I showed you the [employer] Freestyle Libre CGM brochure today, follow up with your insurance to check on the coverage for it.   I'd like you to continue recognizing the positive decisions you make every day in terms of your food choices, diabetes management decisions, and the management of your life as well. I feel that you deserve to give yourself credit for all of the appropriate decisions you make for yourself every day. Perfection is not an obtainable goal!  Congratulations on a great A1c of 6.7%

## 2020-01-12 ENCOUNTER — Ambulatory Visit: Payer: BC Managed Care – PPO | Attending: Internal Medicine

## 2020-01-12 DIAGNOSIS — Z23 Encounter for immunization: Secondary | ICD-10-CM

## 2020-01-12 NOTE — Progress Notes (Signed)
   Covid-19 Vaccination Clinic  Name:  Jocelyn Meyer    MRN: [id number] DOB: [birth year]  01/12/2020  Jocelyn Meyer was observed post Covid-19 immunization for 15 minutes without incidence. She was provided with Vaccine Information Sheet and instruction to access the V-Safe system.   Jocelyn Meyer was instructed to call 911 with any severe reactions post vaccine: Marland Kitchen Difficulty breathing  . Swelling of your face and throat  . A fast heartbeat  . A bad rash all over your body  . Dizziness and weakness    Immunizations Administered    Name Date Dose VIS Date Route   Pfizer COVID-19 Vaccine 01/12/2020  2:34 PM 0.3 mL 11/03/2019 Intramuscular   Manufacturer: [employer], [employer]   Lot: [id number]   NDC: [address]-8

## 2020-02-06 ENCOUNTER — Ambulatory Visit: Payer: BC Managed Care – PPO | Attending: Internal Medicine

## 2020-02-06 DIAGNOSIS — Z23 Encounter for immunization: Secondary | ICD-10-CM

## 2020-02-06 NOTE — Progress Notes (Signed)
   Covid-19 Vaccination Clinic  Name:  Jocelyn Meyer    MRN: [id number] DOB: 1956/04/13  02/06/2020  Jocelyn Meyer was observed post Covid-19 immunization for 15 minutes without incident. She was provided with Vaccine Information Sheet and instruction to access the V-Safe system.   Jocelyn Meyer was instructed to call 911 with any severe reactions post vaccine: Marland Kitchen Difficulty breathing  . Swelling of face and throat  . A fast heartbeat  . A bad rash all over body  . Dizziness and weakness   Immunizations Administered    Name Date Dose VIS Date Route   Pfizer COVID-19 Vaccine 02/06/2020  4:47 PM 0.3 mL 11/03/2019 Intramuscular   Manufacturer: [employer], [employer]   Lot: [id number]   NDC: [address]-4

## 2020-05-06 ENCOUNTER — Other Ambulatory Visit: Payer: Self-pay | Admitting: Internal Medicine

## 2020-05-06 DIAGNOSIS — E2839 Other primary ovarian failure: Secondary | ICD-10-CM

## 2020-05-06 DIAGNOSIS — Z1231 Encounter for screening mammogram for malignant neoplasm of breast: Secondary | ICD-10-CM

## 2020-05-09 ENCOUNTER — Ambulatory Visit
Admission: RE | Admit: 2020-05-09 | Discharge: 2020-05-09 | Disposition: A | Discharge status: Home / Self Care | Payer: BC Managed Care – PPO | Source: Ambulatory Visit | Attending: Internal Medicine | Admitting: Internal Medicine

## 2020-05-09 ENCOUNTER — Other Ambulatory Visit: Payer: Self-pay

## 2020-05-09 DIAGNOSIS — Z1231 Encounter for screening mammogram for malignant neoplasm of breast: Secondary | ICD-10-CM

## 2020-05-09 DIAGNOSIS — E2839 Other primary ovarian failure: Secondary | ICD-10-CM

## 2021-03-06 NOTE — Progress Notes (Signed)
Cardiology Office Note:   Date:  03/07/2021  NAME:  Jocelyn Meyer    MRN: [id number] DOB:  [birth year]   PCP:  Lorenda Ishihara, MD  Cardiologist:  No primary care provider on file.  Electrophysiologist:  None   Referring MD: Lorenda Ishihara,*   Chief Complaint  Patient presents with  . Palpitations    History of Present Illness:   Jocelyn Meyer is a 65 y.o. female with a hx of diabetes, HTN who is being seen today for the evaluation of palpitations at the request of Lorenda Ishihara,*.  She recently had a monitor performed by her primary care physician for palpitations.  That monitor was reported to have an SVT up to 5 beat duration.  There was mention of PVCs but I cannot review the report.  It appears 5 beats was the longest duration of the SVT.  She reports for the last 3 months she has had episodes of palpitations.  They occur 1-2 times per week.  She reports a fluttering in her chest.  They can occur at night.  They can also occur during the day.  They are associated with stress.  She works for the [employer] and reports her job has been stressful.  She reports she may drink 1-2 diet Cokes daily.  No excess caffeine consumption is reported.  She denies any exertional chest pain or shortness of breath.  The symptoms when they do occur last minutes.  She reports no identifiable triggers.  She reports no alleviating factors.  She does have a history of PACs that was captured on a monitor in 2017.  No medications were recommended.  Her EKG in office demonstrates sinus rhythm with a heart rate of 62.  There are no acute ischemic changes or evidence of infarction.  I have no access to recent thyroid studies.  She is diabetic.  She reports her A1c is roughly 6.1.  She is on rather high-dose antidiabetic medications in my opinion.  She remains on glipizide as well as Ozempic.  She reports if she does not eat she will have low sugar spells.  I wonder if this is contributing.   She does have high blood pressure that is well controlled on losartan.  BP 127/82.  She does not exercise routinely but has no limitations per her report.  She is a never smoker.  She does not drink alcohol or use drugs.  She does take pravastatin and informs me that her cholesterol is well controlled.  There is no strong family history of heart disease.  She is married with 2 children.  She has 1 grandchild.  Problem List 1. DM -A1c 6.1 2. HTN  Past Medical History: Past Medical History:  Diagnosis Date  . Arthritis   . Diabetes mellitus without complication (HCC)   . Glaucoma   . Hyperlipidemia   . Hypertension   . Obesity     Past Surgical History: Past Surgical History:  Procedure Laterality Date  . KNEE SURGERY    . SHOULDER ARTHROSCOPY    . TUBAL LIGATION    . WISDOM TOOTH EXTRACTION    . WRIST SURGERY      Current Medications: Current Meds  Medication Sig  . glipiZIDE (GLUCOTROL) 5 MG tablet Take by mouth daily before breakfast.  . losartan (COZAAR) 25 MG tablet Take 25 mg by mouth daily.  . meloxicam (MOBIC) 15 MG tablet Take 7.5 mg by mouth daily.  . pravastatin (PRAVACHOL) 20 MG tablet Take  20 mg by mouth daily.  . timolol (BETIMOL) 0.25 % ophthalmic solution Place 1-2 drops into both eyes daily.      Allergies:    Other and Penicillins   Social History: Social History   Socioeconomic History  . Marital status: Married    Spouse name: Not on file  . Number of children: 2  . Years of education: Not on file  . Highest education level: Not on file  Occupational History  . Not on file  Tobacco Use  . Smoking status: Never Smoker  . Smokeless tobacco: Never Used  Substance and Sexual Activity  . Alcohol use: No  . Drug use: No  . Sexual activity: Never    Birth control/protection: Abstinence  Other Topics Concern  . Not on file  Social History Narrative  . Not on file   Social Determinants of Health   Financial Resource Strain: Not on file   Food Insecurity: Not on file  Transportation Needs: Not on file  Physical Activity: Not on file  Stress: Not on file  Social Connections: Not on file     Family History: The patient's family history includes Asthma in an other family member; Diabetes in her brother, brother, father, sister, and another family member; Hypertension in her brother, brother, father, mother, sister, sister, and another family member.  ROS:   All other ROS reviewed and negative. Pertinent positives noted in the HPI.     EKGs/Labs/Other Studies Reviewed:   The following studies were personally reviewed by me today:  EKG:  EKG is ordered today.  The ekg ordered today demonstrates normal sinus rhythm heart rate 62, no acute ischemic changes or evidence of infarction, and was personally reviewed by me.   Recent Labs: No results found for requested labs within last 8760 hours.   Recent Lipid Panel No results found for: CHOL, TRIG, HDL, CHOLHDL, VLDL, LDLCALC, LDLDIRECT  Physical Exam:   VS:  BP (!) 116/58   Pulse 62   Ht 5\' 3"  (1.6 m)   Wt 165 lb (74.8 kg)   LMP 02/23/2012   SpO2 98%   BMI 29.23 kg/m    Wt Readings from Last 3 Encounters:  03/07/21 165 lb (74.8 kg)  07/27/18 211 lb 1.6 oz (95.8 kg)  06/15/18 212 lb 8 oz (96.4 kg)    General: Well nourished, well developed, in no acute distress Head: Atraumatic, normal size  Eyes: PEERLA, EOMI  Neck: Supple, no JVD Endocrine: No thryomegaly Cardiac: Normal S1, S2; RRR; no murmurs, rubs, or gallops Lungs: Clear to auscultation bilaterally, no wheezing, rhonchi or rales  Abd: Soft, nontender, no hepatomegaly  Ext: No edema, pulses 2+ Musculoskeletal: No deformities, BUE and BLE strength normal and equal Skin: Warm and dry, no rashes   Neuro: Alert and oriented to person, place, time, and situation, CNII-XII grossly intact, no focal deficits  Psych: Normal mood and affect   ASSESSMENT:   Jocelyn Meyer is a 65 y.o. female who presents for  the following: 1. Palpitations   2. SVT (supraventricular tachycardia) (HCC)     PLAN:   1. Palpitations 2. SVT (supraventricular tachycardia) (HCC) -She presents for evaluation of palpitations.  She reports several episodes per week.  She had a Zio patch placed by her primary care physician.  I do not have the results of that but per the report she had a brief SVT that lasted 5 beats in duration.  We will attempt to get the report so I can  review it thoroughly.  She will need to make sure her thyroid has been checked.  I do not see any results of this recently from the lab work sent from her physician.  We discussed that if she is having brief episodes that are 5 beats or less this likely does not require treatment.  I would recommend a conservative approach with adequate hydration, proper exercise and proper sleep.  Her symptoms are also stress related.  I recommended she destress as much as possible.  There is a lot of stress at her job.  She denies any chest pain or shortness of breath.  She does not exercise but has no limitations.  Her cardiovascular examination is without murmurs rubs or gallops.  Her EKG in the office is very normal.  I see no need for further testing.  I think a conservative approach is warranted.  She did have PACs in 2017 and a similar recommendation was made.  Again, assuming what was sent over regarding her SVT is accurate (less than 5 beats) I would not recommend any treatment.  We will reach back out to her once I have thoroughly examined her report.  Disposition: Return if symptoms worsen or fail to improve.  Medication Adjustments/Labs and Tests Ordered: Current medicines are reviewed at length with the patient today.  Concerns regarding medicines are outlined above.  Orders Placed This Encounter  Procedures  . EKG 12-Lead   No orders of the defined types were placed in this encounter.   Patient Instructions  Medication Instructions:  The current medical  regimen is effective;  continue present plan and medications.  *If you need a refill on your cardiac medications before your next appointment, please call your pharmacy*   Follow-Up: At [facility], you and your health needs are our priority.  As part of our continuing mission to provide you with exceptional heart care, we have created designated Provider Care Teams.  These Care Teams include your primary Cardiologist (physician) and Advanced Practice Providers (APPs -  Physician Assistants and Nurse Practitioners) who all work together to provide you with the care you need, when you need it.  We recommend signing up for the patient portal called "MyChart".  Sign up information is provided on this After Visit Summary.  MyChart is used to connect with patients for Virtual Visits (Telemedicine).  Patients are able to view lab/test results, encounter notes, upcoming appointments, etc.  Non-urgent messages can be sent to your provider as well.   To learn more about what you can do with MyChart, go to [email or web address].    Your next appointment:   As needed  The format for your next appointment:   In Person  Provider:   Lennie Odor, MD          Signed, Lenna Gilford. Flora Lipps, MD, [facility]  [facility]  [address] [place], [place] [address] [phone]  03/07/2021 4:59 PM

## 2021-03-07 ENCOUNTER — Ambulatory Visit: Payer: BC Managed Care – PPO | Admitting: Cardiovascular Disease

## 2021-03-07 ENCOUNTER — Other Ambulatory Visit: Payer: Self-pay

## 2021-03-07 ENCOUNTER — Encounter: Payer: Self-pay | Admitting: Cardiovascular Disease

## 2021-03-07 VITALS — BP 116/58 | HR 62 | Ht 63.0 in | Wt 165.0 lb

## 2021-03-07 DIAGNOSIS — I471 Supraventricular tachycardia: Secondary | ICD-10-CM

## 2021-03-07 DIAGNOSIS — R002 Palpitations: Secondary | ICD-10-CM

## 2021-03-07 NOTE — Patient Instructions (Signed)
Medication Instructions:  The current medical regimen is effective;  continue present plan and medications.  *If you need a refill on your cardiac medications before your next appointment, please call your pharmacy*    Follow-Up: At [facility], you and your health needs are our priority.  As part of our continuing mission to provide you with exceptional heart care, we have created designated Provider Care Teams.  These Care Teams include your primary Cardiologist (physician) and Advanced Practice Providers (APPs -  Physician Assistants and Nurse Practitioners) who all work together to provide you with the care you need, when you need it.  We recommend signing up for the patient portal called "MyChart".  Sign up information is provided on this After Visit Summary.  MyChart is used to connect with patients for Virtual Visits (Telemedicine).  Patients are able to view lab/test results, encounter notes, upcoming appointments, etc.  Non-urgent messages can be sent to your provider as well.   To learn more about what you can do with MyChart, go to [email or web address].    Your next appointment:   As needed  The format for your next appointment:   In Person  Provider:   Quinton O'Neal, MD      

## 2021-04-28 IMAGING — MG DIGITAL SCREENING BILAT W/ TOMO W/ CAD
6 of 12 series · 6 of 36 positions shown · non-contrast
Comparison: Previous exam(s).

CLINICAL DATA: Screening.

EXAM:
DIGITAL SCREENING BILATERAL MAMMOGRAM WITH TOMO AND CAD

[R CC synth-2D (1 of 2)]
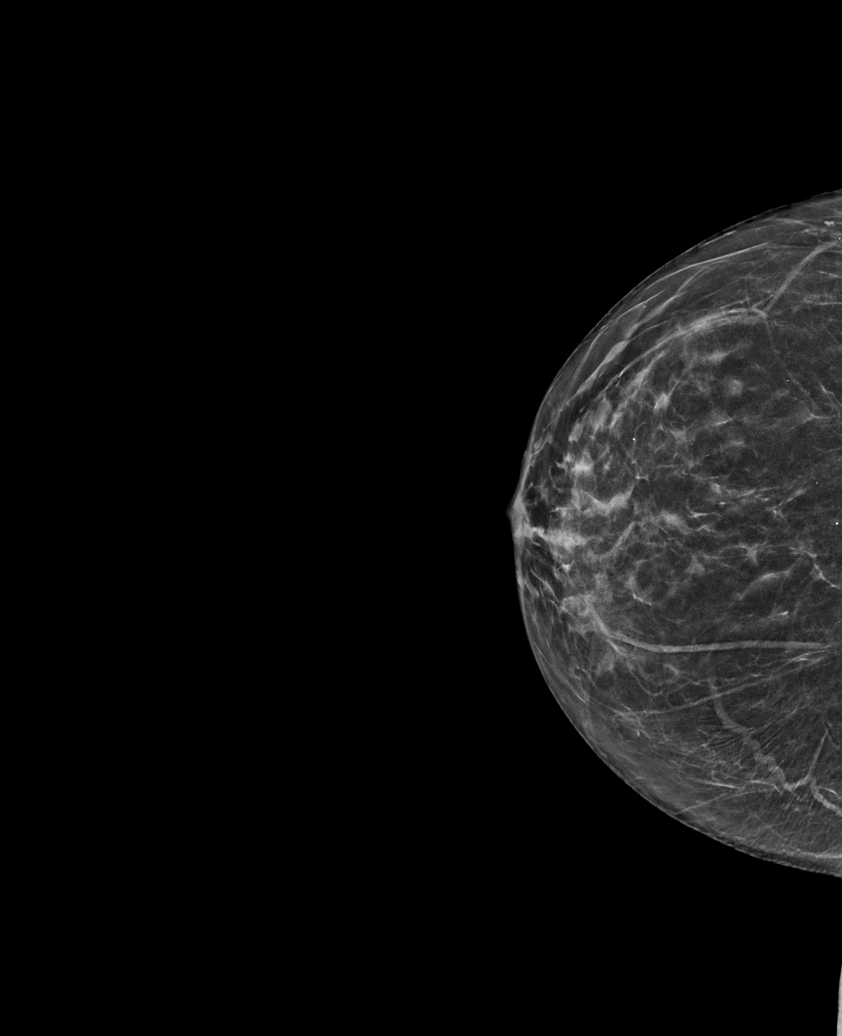

[L CC synth-2D (1 of 2)]
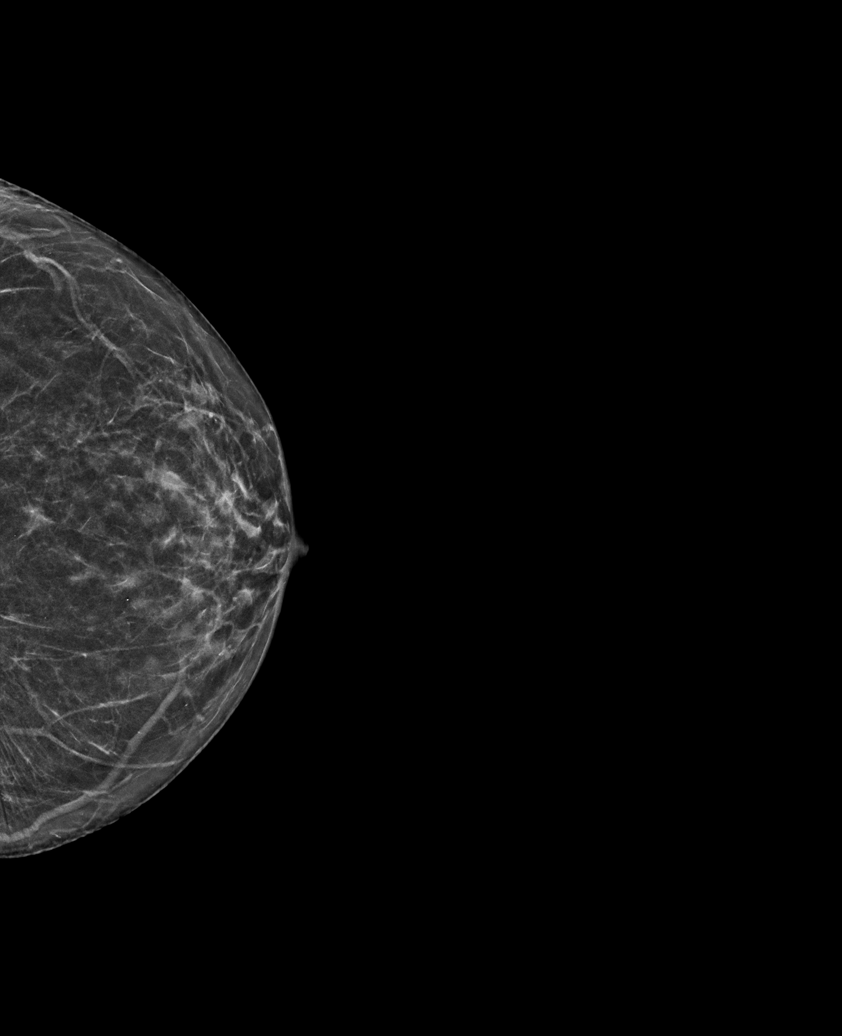

[L MLO synth-2D]
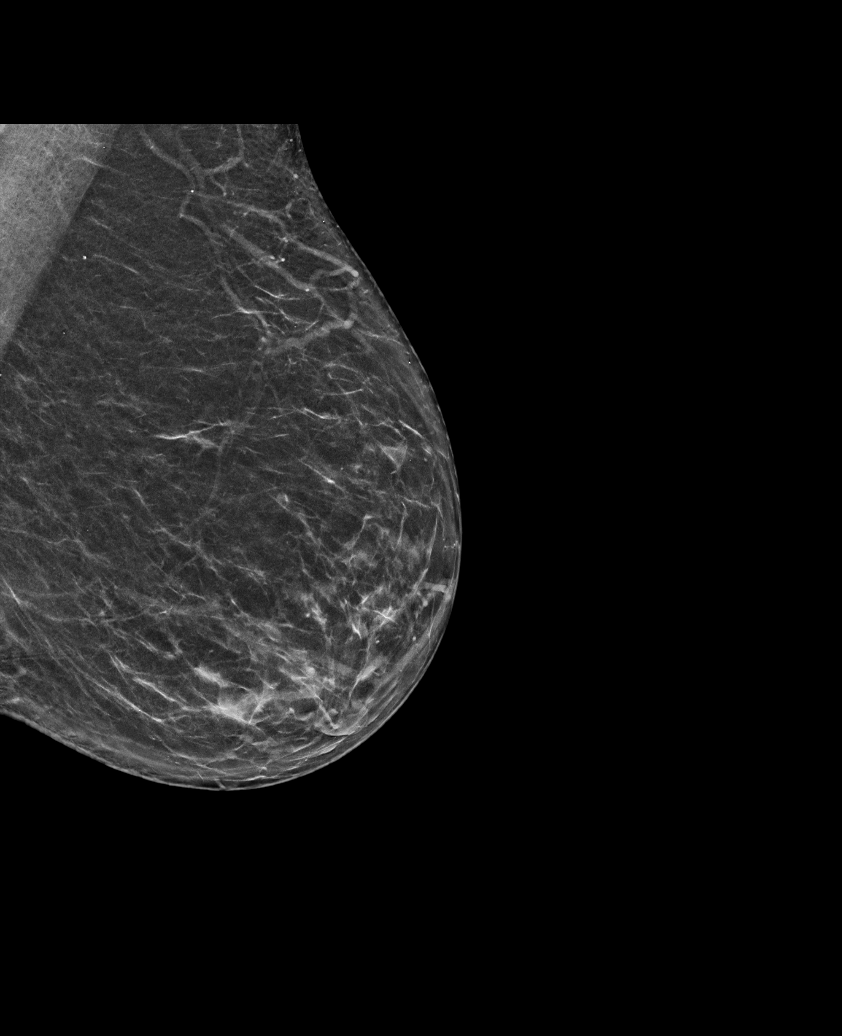

[L CC synth-2D (2 of 2)]
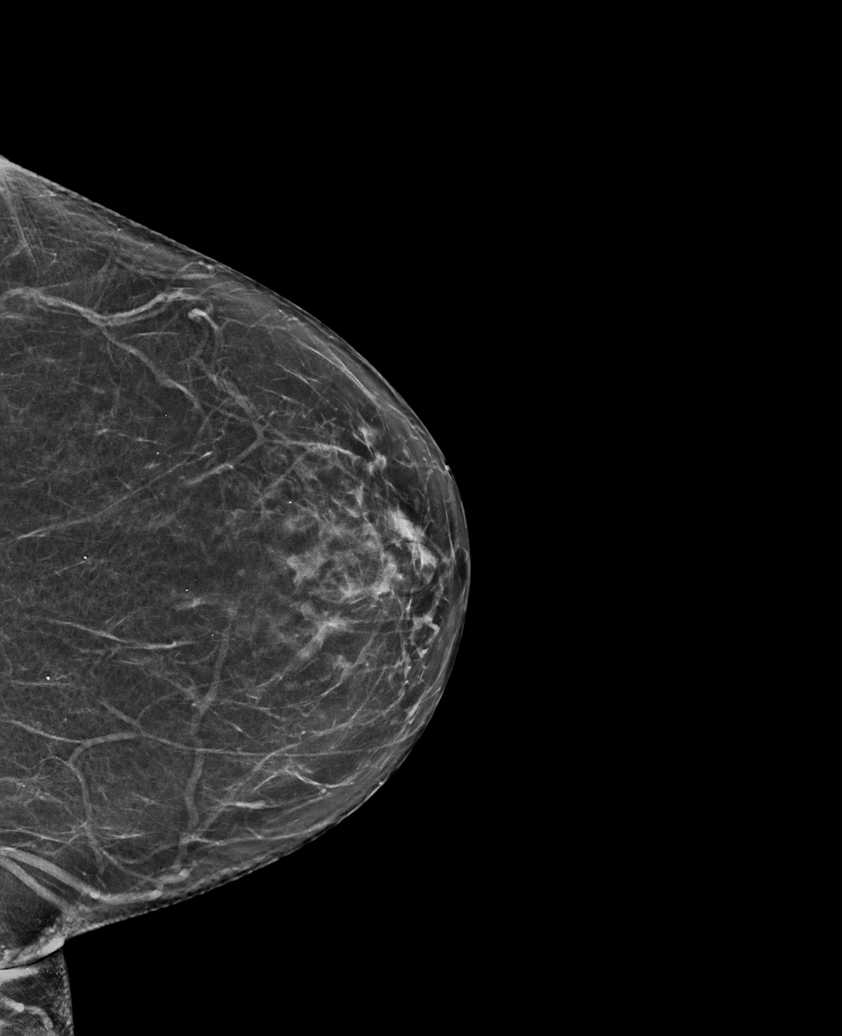

[R CC synth-2D (2 of 2)]
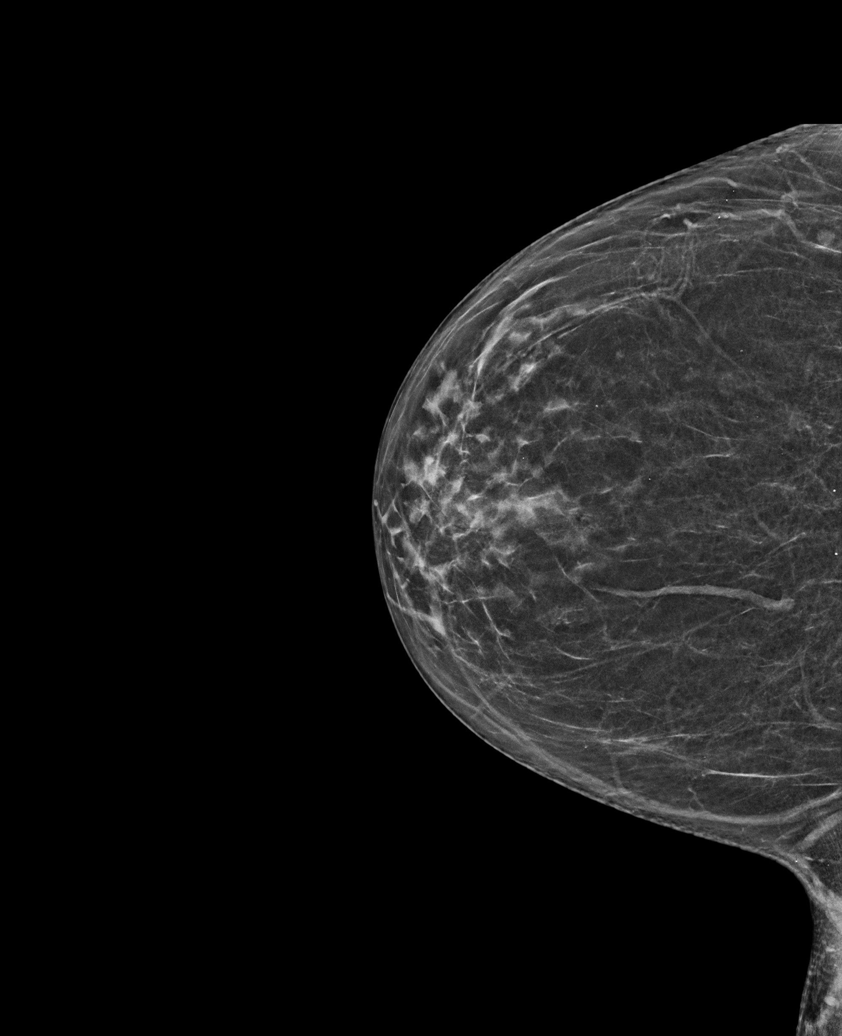

[R MLO synth-2D]
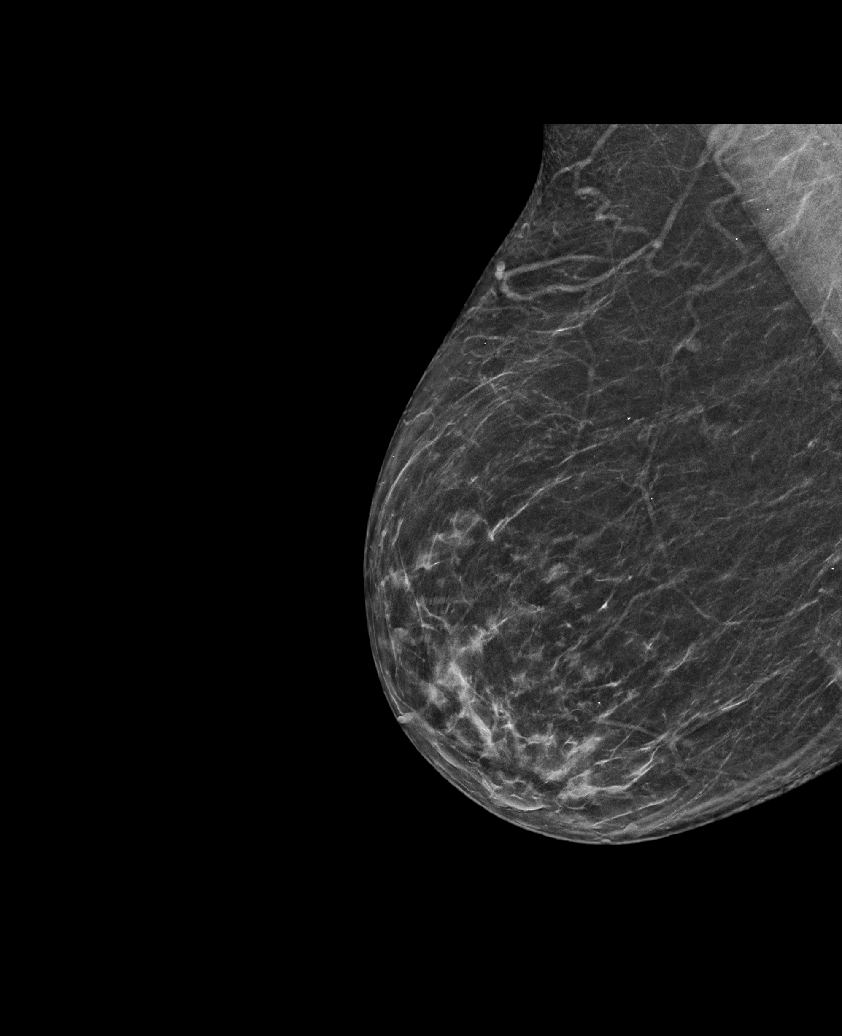

[6 of 36 positions shown; findings below may reference images not displayed]

ACR Breast Density Category b: There are scattered areas of
fibroglandular density.
FINDINGS: There are no findings suspicious for malignancy. Images were
processed with CAD.
IMPRESSION: No mammographic evidence of malignancy. A result letter of this
screening mammogram will be mailed directly to the patient.

RECOMMENDATION:
Screening mammogram in one year. (Code:[id number])

BI-RADS CATEGORY  1: Negative.

## 2022-01-10 ENCOUNTER — Encounter: Payer: Self-pay | Admitting: Emergency Medicine

## 2022-01-10 ENCOUNTER — Other Ambulatory Visit: Payer: Self-pay

## 2022-01-10 ENCOUNTER — Ambulatory Visit
Admission: EM | Admit: 2022-01-10 | Discharge: 2022-01-10 | Disposition: A | Discharge status: Home / Self Care | Payer: BC Managed Care – PPO | Attending: Physician Assistant | Admitting: Physician Assistant

## 2022-01-10 DIAGNOSIS — N3000 Acute cystitis without hematuria: Secondary | ICD-10-CM | POA: Insufficient documentation

## 2022-01-10 LAB — POCT URINALYSIS DIP (MANUAL ENTRY)
Bilirubin, UA: NEGATIVE
Glucose, UA: NEGATIVE mg/dL
Ketones, POC UA: NEGATIVE mg/dL
Nitrite, UA: POSITIVE — AB
Protein Ur, POC: 100 mg/dL — AB
Spec Grav, UA: 1.02 (ref 1.010–1.025)
Urobilinogen, UA: 0.2 E.U./dL
pH, UA: 6.5 (ref 5.0–8.0)

## 2022-01-10 MED ORDER — NITROFURANTOIN MONOHYD MACRO 100 MG PO CAPS: 100.0000 mg | ORAL_CAPSULE | Freq: Two times a day (BID) | ORAL | 0 refills | Status: AC

## 2022-01-10 NOTE — ED Provider Notes (Signed)
[facility]    CSN: [id number] Arrival date & time: 01/10/22  0950      History   Chief Complaint Chief Complaint  Patient presents with   Urinary Urgency    HPI Jocelyn Meyer is a 66 y.o. female.   Patient here today for evaluation of urinary urgency, frequency and dysuria that started yesterday.  She states that she has not had any hematuria.  She denies any fever.  She has not had any significant back pain or abdominal pain.  She does not report treatment for symptoms.  The history is provided by the patient.   Past Medical History:  Diagnosis Date   Arthritis    Diabetes mellitus without complication ([place])    Glaucoma    Hyperlipidemia    Hypertension    Obesity     Patient Active Problem List   Diagnosis Date Noted   Palpitations 05/12/2016   Diabetes ([place]) 04/02/2014    Past Surgical History:  Procedure Laterality Date   KNEE SURGERY     SHOULDER ARTHROSCOPY     TUBAL LIGATION     WISDOM TOOTH EXTRACTION     WRIST SURGERY      OB History   No obstetric history on file.      Home Medications    Prior to Admission medications   Medication Sig Start Date End Date Taking? Authorizing Provider  glipiZIDE (GLUCOTROL) 5 MG tablet Take by mouth daily before breakfast.   Yes [provider]  losartan (COZAAR) 25 MG tablet Take 25 mg by mouth daily.   Yes [provider]  meloxicam (MOBIC) 15 MG tablet Take 7.5 mg by mouth daily.   Yes [provider]  nitrofurantoin, macrocrystal-monohydrate, (MACROBID) 100 MG capsule Take 1 capsule (100 mg total) by mouth 2 (two) times daily. 01/10/22  Yes Francene Finders, PA-C  pravastatin (PRAVACHOL) 20 MG tablet Take 20 mg by mouth daily.   Yes [provider]  Semaglutide, 1 MG/DOSE, (OZEMPIC, 1 MG/DOSE,) 4 MG/3ML SOPN See admin instructions.   Yes [provider]  timolol (BETIMOL) 0.25 % ophthalmic solution Place 1-2 drops into both eyes daily.    Yes  [provider]  diclofenac (VOLTAREN) 50 MG EC tablet Take 50 mg by mouth 2 (two) times daily. Patient not taking: Reported on 03/07/2021    [provider]  Dulaglutide 0.75 MG/0.5ML SOPN Inject 0.75 mg/mL into the skin once a week. Patient not taking: Reported on 03/07/2021    [provider]  FARXIGA 10 MG TABS tablet Take 10 mg by mouth daily. Patient not taking: Reported on 03/07/2021 07/28/17   [provider]  glipiZIDE (GLUCOTROL) 5 MG tablet Take 5 mg by mouth 2 (two) times daily. Patient not taking: Reported on 03/07/2021 07/28/17   [provider]  Glucosamine 500 MG CAPS Take 1,000 mg by mouth. Reported on 12/25/2015 Patient not taking: Reported on 03/07/2021    [provider]  latanoprost (XALATAN) 0.005 % ophthalmic solution 1 drop at bedtime. Patient not taking: Reported on 03/07/2021    [provider]  metFORMIN (GLUCOPHAGE) 500 MG tablet Take 250 mg by mouth 2 (two) times daily with a meal.  Patient not taking: Reported on 03/07/2021    [provider]  Multiple Vitamins-Minerals (MULTIVITAMIN WITH MINERALS) tablet Take 1 tablet by mouth daily. Patient not taking: Reported on 03/07/2021    [provider]  omeprazole (PRILOSEC) 20 MG capsule Take 20 mg by mouth  daily. Patient not taking: Reported on 03/07/2021    [provider]  ondansetron (ZOFRAN ODT) 4 MG disintegrating tablet Take 1 tablet (4 mg total) by mouth every 8 (eight) hours as needed for nausea or vomiting. Patient not taking: Reported on 03/07/2021 08/04/17   Kinnie Feil, PA-C  ondansetron (ZOFRAN) 4 MG tablet Take 4 mg by mouth as needed for nausea/vomiting. Patient not taking: Reported on 03/07/2021 07/29/17   [provider]  ranitidine (ZANTAC) 150 MG tablet Take 150 mg by mouth 2 (two) times daily. Patient not taking: Reported on 03/07/2021    [provider]  triamterene-hydrochlorothiazide (DYAZIDE) 37.5-25  MG per capsule Take 1 capsule by mouth every morning. Patient not taking: Reported on 03/07/2021    [provider]  VICTOZA 18 MG/3ML SOPN Inject 1.2 mg into the skin daily. Patient not taking: Reported on 03/07/2021 07/20/17   [provider]    Family History Family History  Problem Relation Age of Onset   Hypertension Mother    Diabetes Father    Hypertension Father    Hypertension Sister    Diabetes Brother    Hypertension Brother    Diabetes Sister    Hypertension Sister    Diabetes Brother    Hypertension Brother    Asthma Other    Hypertension Other    Diabetes Other     Social History Social History   Tobacco Use   Smoking status: Never   Smokeless tobacco: Never  Substance Use Topics   Alcohol use: No   Drug use: No     Allergies   Other and Penicillins   Review of Systems Review of Systems  Constitutional:  Negative for chills and fever.  Eyes:  Negative for discharge and redness.  Respiratory:  Negative for shortness of breath.   Cardiovascular:  Negative for chest pain.  Gastrointestinal:  Negative for abdominal pain, nausea and vomiting.  Genitourinary:  Positive for dysuria, frequency and urgency. Negative for flank pain.  Musculoskeletal:  Negative for back pain.    Physical Exam Triage Vital Signs ED Triage Vitals  Enc Vitals Group     BP      Pulse      Resp      Temp      Temp src      SpO2      Weight      Height      Head Circumference      Peak Flow      Pain Score      Pain Loc      Pain Edu?      Excl. in [place]?    No data found.  Updated Vital Signs BP 128/68 (BP Location: Left Arm)    Pulse 61    Temp 98.1 F (36.7 C) (Oral)    Ht 5\' 4"  (1.626 m)    Wt 189 lb (85.7 kg)    LMP 02/23/2012    SpO2 96%    BMI 32.44 kg/m     Physical Exam Vitals and nursing note reviewed.  Constitutional:      General: She is not in acute distress.    Appearance: Normal appearance. She is not ill-appearing.  HENT:      Head: Normocephalic and atraumatic.  Eyes:     Conjunctiva/sclera: Conjunctivae normal.  Cardiovascular:     Rate and Rhythm: Normal rate.  Pulmonary:     Effort: Pulmonary effort is normal.  Neurological:  Mental Status: She is alert.  Psychiatric:        Mood and Affect: Mood normal.        Behavior: Behavior normal.        Thought Content: Thought content normal.     UC Treatments / Results  Labs (all labs ordered are listed, but only abnormal results are displayed) Labs Reviewed  POCT URINALYSIS DIP (MANUAL ENTRY) - Abnormal; Notable for the following components:      Result Value   Color, UA light yellow (*)    Clarity, UA cloudy (*)    Blood, UA moderate (*)    Protein Ur, POC =100 (*)    Nitrite, UA Positive (*)    Leukocytes, UA Moderate (2+) (*)    All other components within normal limits  URINE CULTURE    EKG   Radiology No results found.  Procedures Procedures (including critical care time)  Medications Ordered in UC Medications - No data to display  Initial Impression / Assessment and Plan / UC Course  I have reviewed the triage vital signs and the nursing notes.  Pertinent labs & imaging results that were available during my care of the patient were reviewed by me and considered in my medical decision making (see chart for details).    UA consistent with UTI.  Will treat with Macrobid and urine culture ordered.  Encouraged follow-up if symptoms fail to improve or worsen.  Final Clinical Impressions(s) / UC Diagnoses   Final diagnoses:  Acute cystitis without hematuria   Discharge Instructions   None    ED Prescriptions     Medication Sig Dispense Auth. Provider   nitrofurantoin, macrocrystal-monohydrate, (MACROBID) 100 MG capsule Take 1 capsule (100 mg total) by mouth 2 (two) times daily. 10 capsule Francene Finders, PA-C      PDMP not reviewed this encounter.   Francene Finders, PA-C 01/10/22 1248

## 2022-01-10 NOTE — ED Triage Notes (Signed)
Patient c/o urinary urgency, frequency and dysuria x 1 day.  Denies any hematuria.  Afebrile.

## 2022-01-12 LAB — URINE CULTURE: Culture: >=100000 — AB

## 2023-09-15 ENCOUNTER — Other Ambulatory Visit: Payer: Self-pay | Admitting: Internal Medicine

## 2023-09-15 DIAGNOSIS — M85859 Other specified disorders of bone density and structure, unspecified thigh: Secondary | ICD-10-CM

## 2023-09-15 DIAGNOSIS — Z1231 Encounter for screening mammogram for malignant neoplasm of breast: Secondary | ICD-10-CM

## 2023-09-23 ENCOUNTER — Ambulatory Visit
Admission: RE | Admit: 2023-09-23 | Discharge: 2023-09-23 | Disposition: A | Discharge status: Home / Self Care | Payer: BC Managed Care – PPO | Source: Ambulatory Visit | Attending: Internal Medicine | Admitting: Internal Medicine

## 2023-09-23 DIAGNOSIS — M85859 Other specified disorders of bone density and structure, unspecified thigh: Secondary | ICD-10-CM

## 2023-09-28 ENCOUNTER — Ambulatory Visit
Admission: RE | Admit: 2023-09-28 | Discharge: 2023-09-28 | Disposition: A | Discharge status: Home / Self Care | Payer: BC Managed Care – PPO | Source: Ambulatory Visit | Attending: Internal Medicine | Admitting: Internal Medicine

## 2023-09-28 DIAGNOSIS — Z1231 Encounter for screening mammogram for malignant neoplasm of breast: Secondary | ICD-10-CM
